# Patient Record
Sex: Male | Born: 1985
Health system: Southern US, Community
[De-identification: ages and names within clinical notes are randomized; demographics above are authoritative.]

## PROBLEM LIST (undated history)

## (undated) ENCOUNTER — Ambulatory Visit (HOSPITAL_COMMUNITY): Admission: EM

## (undated) DIAGNOSIS — T7840XA Allergy, unspecified, initial encounter: Secondary | ICD-10-CM

## (undated) HISTORY — DX: Allergy, unspecified, initial encounter: T78.40XA

---
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
## 2014-05-17 ENCOUNTER — Emergency Department (HOSPITAL_COMMUNITY)
Admission: EM | Admit: 2014-05-17 | Discharge: 2014-05-17 | Disposition: A | Discharge status: Home / Self Care | Payer: Self-pay | Attending: Emergency Medicine | Admitting: Emergency Medicine

## 2014-05-17 ENCOUNTER — Encounter (HOSPITAL_COMMUNITY): Payer: Self-pay | Admitting: Emergency Medicine

## 2014-05-17 DIAGNOSIS — B079 Viral wart, unspecified: Secondary | ICD-10-CM | POA: Insufficient documentation

## 2014-05-17 MED ORDER — SALICYLIC ACID 17 % EX GEL: Freq: Every day | CUTANEOUS | Status: DC

## 2014-05-17 NOTE — Discharge Instructions (Signed)
Return to the ED with any concerns including difficulty breathing, fainting, decreased level of alertness/lethargy, or any other alarming symptoms °

## 2014-05-17 NOTE — ED Notes (Signed)
Pt presents to department for evaluation of L foot pain. Ongoing x8 months. States itching and redness to foot. No relief with over the counter creams and ointments. 5/10 pain at the time.

## 2014-05-17 NOTE — ED Provider Notes (Signed)
CSN: [id number]     Arrival date & time 05/17/14  1808 History   First MD Initiated Contact with Patient 05/17/14 1908     Chief Complaint  Patient presents with  . Foot Pain     (Consider location/radiation/quality/duration/timing/severity/associated sxs/prior Treatment) HPI Pt presenting with c/o pain between his toes on his left foot. He states area is painful and itching.  He first noted this 8 months ago.  He went to the pharmacy and was recommended to use athlete's foot powder and then ointment both of which did not work.  He states the pain has continued and now it is painful for him to wear shoes.  No fever.  No injury to foot.  There are no other associated systemic symptoms, there are no other alleviating or modifying factors.   History reviewed. No pertinent past medical history. History reviewed. No pertinent past surgical history. No family history on file. History  Substance Use Topics  . Smoking status: Never Smoker   . Smokeless tobacco: Not on file  . Alcohol Use: No    Review of Systems ROS reviewed and all otherwise negative except for mentioned in HPI    Allergies  Review of patient's allergies indicates no known allergies.  Home Medications   Prior to Admission medications   Medication Sig Start Date End Date Taking? Authorizing Provider  salicylic acid 17 % gel Apply topically daily. 05/17/14   Ethelda Chick, MD   BP 146/85  Pulse 75  Temp(Src) 97.7 F (36.5 C) (Oral)  Resp 20  Wt 163 lb 9 oz (74.191 kg)  SpO2 98% Vitals reviewed Physical Exam Physical Examination: General appearance - alert, well appearing, and in no distress Mental status - alert, oriented to person, place, and time Musculoskeletal - no joint tenderness, deformity or swelling Extremities - peripheral pulses normal, no pedal edema, no clubbing or cyanosis Skin - normal coloration and turgor, no rashes, verrucous between 4th and 5th toes of left foot at base of web space, no  surrounding erythema or fluctuance  ED Course  Procedures (including critical care time) Labs Review Labs Reviewed - No data to display  Imaging Review No results found.   EKG Interpretation None      MDM   Final diagnoses:  Wart    Pt presenting with c/o pain between 4th/5th toes- on exam lesion is most c/w wart.  Advised topical salicylic acid and/or seeing primary care physician or dermatologist for liquid nitrogen treatment.  I have discussed strict precautions to return to the ER including or any other new or worsening symptoms. The patient understands and accepts the medical plan as it's been recorded in the chart, and I have answered their questions. Discharge instructions concerning home care and prescriptions have been given. The patient is stable and is discharged to home in good condition.     Ethelda Chick, MD 05/17/14 [phone]

## 2015-01-21 ENCOUNTER — Encounter (HOSPITAL_COMMUNITY): Payer: Self-pay | Admitting: Emergency Medicine

## 2015-01-21 ENCOUNTER — Emergency Department (HOSPITAL_COMMUNITY)
Admission: EM | Admit: 2015-01-21 | Discharge: 2015-01-21 | Disposition: A | Discharge status: Home / Self Care | Payer: BLUE CROSS/BLUE SHIELD | Source: Home / Self Care | Attending: Family Medicine | Admitting: Family Medicine

## 2015-01-21 DIAGNOSIS — B356 Tinea cruris: Secondary | ICD-10-CM

## 2015-01-21 DIAGNOSIS — B353 Tinea pedis: Secondary | ICD-10-CM

## 2015-01-21 MED ORDER — CLOTRIMAZOLE-BETAMETHASONE 1-0.05 % EX CREA: TOPICAL_CREAM | CUTANEOUS | Status: DC

## 2015-01-21 NOTE — ED Provider Notes (Signed)
CSN: [id number]     Arrival date & time 01/21/15  1054 History   First MD Initiated Contact with Patient 01/21/15 1108     Chief Complaint  Patient presents with  . Rash   (Consider location/radiation/quality/duration/timing/severity/associated sxs/prior Treatment) HPI       29 year old male presents for evaluation of a painful rash between his fourth and fifth toes on his left foot as well as a rash in his groin. This started a few weeks ago. He went to the pharmacy and was told to get some sort of powder but it is not helping despite using it every day. No swelling or drainage. Pain is worse when he was wearing shoes. Both the groin rash and the rash in his toes are very itchy.   History reviewed. No pertinent past medical history. History reviewed. No pertinent past surgical history. No family history on file. History  Substance Use Topics  . Smoking status: Never Smoker   . Smokeless tobacco: Not on file  . Alcohol Use: No    Review of Systems  Skin: Positive for rash.    Allergies  Review of patient's allergies indicates no known allergies.  Home Medications   Prior to Admission medications   Medication Sig Start Date End Date Taking? Authorizing Provider  clotrimazole-betamethasone (LOTRISONE) cream Apply to affected area 2 times daily for 2 weeks 01/21/15   Graylon GoodZachary H Trust Crago, PA-C  salicylic acid 17 % gel Apply topically daily. 05/17/14   Ethelda ChickMartha K Linker, MD   BP 135/86 mmHg  Pulse 62  Temp(Src) 98.6 F (37 C) (Oral)  Resp 16  SpO2 96% Physical Exam  Constitutional: He is oriented to person, place, and time. He appears well-developed and well-nourished. No distress.  HENT:  Head: Normocephalic.  Pulmonary/Chest: Effort normal. No respiratory distress.  Neurological: He is alert and oriented to person, place, and time. Coordination normal.  Skin: Skin is warm and dry. Rash noted. He is not diaphoretic.  Between the fourth and fifth toes on the left foot, sent with  tinea pedis. In the groin he has a rash consistent with tinea cruris  Psychiatric: He has a normal mood and affect. Judgment normal.  Nursing note and vitals reviewed.   ED Course  Procedures (including critical care time) Labs Review Labs Reviewed - No data to display  Imaging Review No results found.   MDM   1. Tinea pedis of left foot   2. Tinea cruris    Treat with Lotrisone. Follow-up when necessary   Meds ordered this encounter  Medications  . clotrimazole-betamethasone (LOTRISONE) cream    Sig: Apply to affected area 2 times daily for 2 weeks    Dispense:  45 g    Refill:  0       Graylon GoodZachary H Dez Stauffer, PA-C 01/21/15 1134

## 2015-01-21 NOTE — Discharge Instructions (Signed)
Athlete's Foot Athlete's foot (tinea pedis) is a fungal infection of the skin on the feet. It often occurs on the skin between the toes or underneath the toes. It can also occur on the soles of the feet. Athlete's foot is more likely to occur in hot, humid weather. Not washing your feet or changing your socks often enough can contribute to athlete's foot. The infection can spread from person to person (contagious). CAUSES Athlete's foot is caused by a fungus. This fungus thrives in warm, moist places. Most people get athlete's foot by sharing shower stalls, towels, and wet floors with an infected person. People with weakened immune systems, including those with diabetes, may be more likely to get athlete's foot. SYMPTOMS   Itchy areas between the toes or on the soles of the feet.  White, flaky, or scaly areas between the toes or on the soles of the feet.  Tiny, intensely itchy blisters between the toes or on the soles of the feet.  Tiny cuts on the skin. These cuts can develop a bacterial infection.  Thick or discolored toenails. DIAGNOSIS  Your caregiver can usually tell what the problem is by doing a physical exam. Your caregiver may also take a skin sample from the rash area. The skin sample may be examined under a microscope, or it may be tested to see if fungus will grow in the sample. A sample may also be taken from your toenail for testing. TREATMENT  Over-the-counter and prescription medicines can be used to kill the fungus. These medicines are available as powders or creams. Your caregiver can suggest medicines for you. Fungal infections respond slowly to treatment. You may need to continue using your medicine for several weeks. PREVENTION   Do not share towels.  Wear sandals in wet areas, such as shared locker rooms and shared showers.  Keep your feet dry. Wear shoes that allow air to circulate. Wear cotton or wool socks. HOME CARE INSTRUCTIONS   Take medicines as directed by  your caregiver. Do not use steroid creams on athlete's foot.  Keep your feet clean and cool. Wash your feet daily and dry them thoroughly, especially between your toes.  Change your socks every day. Wear cotton or wool socks. In hot climates, you may need to change your socks 2 to 3 times per day.  Wear sandals or canvas tennis shoes with good air circulation.  If you have blisters, soak your feet in Burow's solution or Epsom salts for 20 to 30 minutes, 2 times a day to dry out the blisters. Make sure you dry your feet thoroughly afterward. SEEK MEDICAL CARE IF:   You have a fever.  You have swelling, soreness, warmth, or redness in your foot.  You are not getting better after 7 days of treatment.  You are not completely cured after 30 days.  You have any problems caused by your medicines. MAKE SURE YOU:   Understand these instructions.  Will watch your condition.  Will get help right away if you are not doing well or get worse. Document Released: 11/25/2000 Document Revised: 02/20/2012 Document Reviewed: 09/16/2011 [facility] Patient Information 2015 [place], [place]. This information is not intended to replace advice given to you by your health care provider. Make sure you discuss any questions you have with your health care provider.  Jock Itch Jock itch is a fungal infection of the skin in the groin area. It is sometimes called "ringworm" even though it is not caused by a worm. A fungus  is a type of germ that thrives in dark, damp places.  CAUSES  This infection may spread from:  A fungus infection elsewhere on the body (such as athlete's foot).  Sharing towels or clothing. This infection is more common in:  Hot, humid climates.  People who wear tight-fitting clothing or wet bathing suits for long periods of time.  Athletes.  Overweight people.  People with diabetes. SYMPTOMS  Jock itch causes the following symptoms:  Red, pink or brown rash in the groin. Rash may  spread to the thighs, anus, and buttocks.  Itching. DIAGNOSIS  Your caregiver may make the diagnosis by looking at the rash. Sometimes a skin scraping will be sent to test for fungus. Testing can be done either by looking under the microscope or by doing a culture (test to try to grow the fungus). A culture can take up to 2 weeks to come back. TREATMENT  Jock itch may be treated with:  Skin cream or ointment to kill fungus.  Medicine by mouth to kill fungus.  Skin cream or ointment to calm the itching.  Compresses or medicated powders to dry the infected skin. HOME CARE INSTRUCTIONS   Be sure to treat the rash completely. Follow your caregiver's instructions. It can take a couple of weeks to treat. If you do not treat the infection long enough, the rash can come back.  Wear loose-fitting clothing.  Men should wear cotton boxer shorts.  Women should wear cotton underwear.  Avoid hot baths.  Dry the groin area well after bathing. SEEK MEDICAL CARE IF:   Your rash is worse.  Your rash is spreading.  Your rash returns after treatment is finished.  Your rash is not gone in 4 weeks. Fungal infections are slow to respond to treatment. Some redness may remain for several weeks after the fungus is gone. SEEK IMMEDIATE MEDICAL CARE IF:  The area becomes red, warm, tender, and swollen.  You have a fever. Document Released: 11/18/2002 Document Revised: 02/20/2012 Document Reviewed: 10/17/2008 [facility] Patient Information 2015 [place]. This information is not intended to replace advice given to you by your health care provider. Make sure you discuss any questions you have with your health care provider.

## 2015-01-21 NOTE — ED Notes (Signed)
C/o rash in between left 4th and 5th toes; has been applying OTC powder w/no relief Also c/o rash on genital area Alert, no signs of acute distress.

## 2015-02-22 ENCOUNTER — Emergency Department (INDEPENDENT_AMBULATORY_CARE_PROVIDER_SITE_OTHER)
Admission: EM | Admit: 2015-02-22 | Discharge: 2015-02-22 | Disposition: A | Discharge status: Home / Self Care | Payer: BLUE CROSS/BLUE SHIELD | Source: Home / Self Care | Attending: Family Medicine | Admitting: Family Medicine

## 2015-02-22 ENCOUNTER — Encounter (HOSPITAL_COMMUNITY): Payer: Self-pay | Admitting: Family Medicine

## 2015-02-22 DIAGNOSIS — A048 Other specified bacterial intestinal infections: Secondary | ICD-10-CM

## 2015-02-22 DIAGNOSIS — B9681 Helicobacter pylori [H. pylori] as the cause of diseases classified elsewhere: Secondary | ICD-10-CM

## 2015-02-22 DIAGNOSIS — I10 Essential (primary) hypertension: Secondary | ICD-10-CM

## 2015-02-22 LAB — POCT H PYLORI SCREEN: H. PYLORI SCREEN, POC: POSITIVE — AB

## 2015-02-22 MED ORDER — ONDANSETRON HCL 4 MG PO TABS: 4.0000 mg | ORAL_TABLET | Freq: Three times a day (TID) | ORAL | Status: DC | PRN

## 2015-02-22 MED ORDER — AMOXICILL-CLARITHRO-LANSOPRAZ PO MISC: Freq: Two times a day (BID) | ORAL | Status: DC

## 2015-02-22 NOTE — ED Provider Notes (Signed)
CSN: [id number]     Arrival date & time 02/22/15  1610 History   First MD Initiated [id number] with Patient 02/22/15 1710     No chief complaint on file.  (Consider location/radiation/quality/duration/timing/severity/associated sxs/prior Treatment) HPI  Rash: noted on previous exam. Resolved.   Abd pain: started 2 weeks ago. Then resolved after a few days then returned 1 week ago. Associated w/ lots of burping. Tried OTC gas relief medicine. BM daily. Left sided w/ some radiation to R. Comes and goes. Improves w/ burping. Worse at night after meals. Denies CP, fevers, SOB, palpitations, nausea, vomiting, diarrhea.    History reviewed. No pertinent past medical history. History reviewed. No pertinent past surgical history. Family History  Problem Relation Age of Onset  . Cancer Neg Hx   . Diabetes Neg Hx   . Heart failure Neg Hx   . Hyperlipidemia Neg Hx    History  Substance Use Topics  . Smoking status: Never Smoker   . Smokeless tobacco: Not on file  . Alcohol Use: No    Review of Systems Per HPI with all other pertinent systems negative.   Allergies  Review of patient's allergies indicates no known allergies.  Home Medications   Prior to Admission medications   Medication Sig Start Date End Date Taking? Authorizing Provider  amoxicillin-clarithromycin-lansoprazole [facility](PREVPAC) combo pack Take by mouth 2 (two) times daily. Follow package directions. 02/22/15   Ozella Rocksavid J Yuka Lallier, MD  ondansetron (ZOFRAN) 4 MG tablet Take 1 tablet (4 mg total) by mouth every 8 (eight) hours as needed for nausea or vomiting. 02/22/15   Ozella Rocksavid J Jeffory Snelgrove, MD   BP 150/81 mmHg  Pulse 60  Temp(Src) 98.5 F (36.9 C) (Oral)  Resp 18 Physical Exam  Constitutional: He is oriented to person, place, and time. He appears well-developed and well-nourished. No distress.  HENT:  Head: Normocephalic.  Eyes: Pupils are equal, round, and reactive to light.  Neck: Normal range of motion.  Cardiovascular: Normal  rate, normal heart sounds and intact distal pulses.   No murmur heard. Pulmonary/Chest: Effort normal and breath sounds normal. No respiratory distress.  Abdominal: Bowel sounds are normal. He exhibits no distension and no mass. There is no tenderness. There is no rebound and no guarding.  Musculoskeletal: Normal range of motion. He exhibits no tenderness.  Neurological: He is alert and oriented to person, place, and time.  Skin: Skin is warm. He is not diaphoretic.  Psychiatric: He has a normal mood and affect. His behavior is normal. Judgment normal.    ED Course  Procedures (including critical care time) Labs Review Labs Reviewed - No data to display  Imaging Review No results found.   MDM   1. Essential hypertension   2. H. pylori infection    H. pylori point of care test positive. Start Prevpac. Hypertension likely secondary to acute illness. Follow-up PCP when necessary. Zofran when necessary nausea  Precautions given and all questions answered  Shelly Flattenavid Ayelet Gruenewald, MD Family Medicine 02/22/2015, 5:25 PM      Ozella Rocksavid J Carollynn Pennywell, MD 02/22/15 1725

## 2015-02-22 NOTE — Discharge Instructions (Signed)
You have an infection of your stomach called H Pylori. This will require antibiotics and other medicines in order to clear. Please taken as prescribed. Please use the Zofran for general nausea and upset stomach. Please avoid using NSAIDs (ibuprofen, Advil, Aleve, Motrin). Please take a daily probiotic.

## 2015-02-22 NOTE — ED Notes (Signed)
Assessment per Dr. Merrell. 

## 2015-10-07 ENCOUNTER — Ambulatory Visit (INDEPENDENT_AMBULATORY_CARE_PROVIDER_SITE_OTHER): Payer: BLUE CROSS/BLUE SHIELD | Admitting: Family Medicine

## 2015-10-07 ENCOUNTER — Telehealth: Payer: Self-pay

## 2015-10-07 VITALS — BP 120/70 | HR 60 | Temp 97.7°F | Resp 16 | Ht 69.5 in | Wt 173.8 lb

## 2015-10-07 DIAGNOSIS — R142 Eructation: Secondary | ICD-10-CM

## 2015-10-07 DIAGNOSIS — R140 Abdominal distension (gaseous): Secondary | ICD-10-CM | POA: Diagnosis not present

## 2015-10-07 DIAGNOSIS — Z0000 Encounter for general adult medical examination without abnormal findings: Secondary | ICD-10-CM

## 2015-10-07 LAB — POCT CBC
Granulocyte percent: 48.2 %G (ref 37–80)
HCT, POC: 42.7 % — AB (ref 43.5–53.7)
Hemoglobin: 13.9 g/dL — AB (ref 14.1–18.1)
Lymph, poc: 1.3 (ref 0.6–3.4)
MCH, POC: 23.2 pg — AB (ref 27–31.2)
MCHC: 32.6 g/dL (ref 31.8–35.4)
MCV: 71.2 fL — AB (ref 80–97)
MID (cbc): 0.3 (ref 0–0.9)
MPV: 7.6 fL (ref 0–99.8)
POC Granulocyte: 1.5 — AB (ref 2–6.9)
POC LYMPH PERCENT: 42.7 %L (ref 10–50)
POC MID %: 9.1 %M (ref 0–12)
Platelet Count, POC: 2.8 10*3/uL — AB (ref 142–424)
RBC: 6 M/uL (ref 4.69–6.13)
RDW, POC: 14.3 %
WBC: 3.1 10*3/uL — AB (ref 4.6–10.2)

## 2015-10-07 LAB — POCT URINALYSIS DIP (MANUAL ENTRY)
Bilirubin, UA: NEGATIVE
Blood, UA: NEGATIVE
Glucose, UA: NEGATIVE
Ketones, POC UA: NEGATIVE
Leukocytes, UA: NEGATIVE
Nitrite, UA: NEGATIVE
Protein Ur, POC: NEGATIVE
Spec Grav, UA: 1.025
Urobilinogen, UA: 0.2
pH, UA: 5.5

## 2015-10-07 LAB — COMPLETE METABOLIC PANEL WITH GFR
ALT: 16 U/L (ref 9–46)
AST: 21 U/L (ref 10–40)
Albumin: 4.3 g/dL (ref 3.6–5.1)
Alkaline Phosphatase: 58 U/L (ref 40–115)
BUN: 18 mg/dL (ref 7–25)
CO2: 28 mmol/L (ref 20–31)
Calcium: 9.6 mg/dL (ref 8.6–10.3)
Chloride: 103 mmol/L (ref 98–110)
Creat: 0.97 mg/dL (ref 0.60–1.35)
GFR, Est African American: >89 mL/min (ref >=60)
GFR, Est Non African American: >89 mL/min (ref >=60)
Glucose, Bld: 86 mg/dL (ref 65–99)
Potassium: 4.6 mmol/L (ref 3.5–5.3)
Sodium: 139 mmol/L (ref 135–146)
Total Bilirubin: 0.6 mg/dL (ref 0.2–1.2)
Total Protein: 7.3 g/dL (ref 6.1–8.1)

## 2015-10-07 LAB — LIPID PANEL
Cholesterol: 176 mg/dL (ref 125–200)
HDL: 67 mg/dL (ref >=40)
LDL Cholesterol: 99 mg/dL (ref <130)
Total CHOL/HDL Ratio: 2.6 Ratio (ref <=5.0)
Triglycerides: 51 mg/dL (ref <150)
VLDL: 10 mg/dL (ref <30)

## 2015-10-07 NOTE — Patient Instructions (Signed)
I want you to pick up some Bean-o and take 1 tablet twice a day. If you're still having gas problems after 2 weeks of doing this, I will you to call me so it can further evaluate the problem.    Health Maintenance, Male A healthy lifestyle and preventative care can promote health and wellness.  Maintain regular health, dental, and eye exams.  Eat a healthy diet. Foods like vegetables, fruits, whole grains, low-fat dairy products, and lean protein foods contain the nutrients you need and are low in calories. Decrease your intake of foods high in solid fats, added sugars, and salt. Get information about a proper diet from your health care provider, if necessary.  Regular physical exercise is one of the most important things you can do for your health. Most adults should get at least 150 minutes of moderate-intensity exercise (any activity that increases your heart rate and causes you to sweat) each week. In addition, most adults need muscle-strengthening exercises on 2 or more days a week.   Maintain a healthy weight. The body mass index (BMI) is a screening tool to identify possible weight problems. It provides an estimate of body fat based on height and weight. Your health care provider can find your BMI and can help you achieve or maintain a healthy weight. For males 20 years and older:  A BMI below 18.5 is considered underweight.  A BMI of 18.5 to 24.9 is normal.  A BMI of 25 to 29.9 is considered overweight.  A BMI of 30 and above is considered obese.  Maintain normal blood lipids and cholesterol by exercising and minimizing your intake of saturated fat. Eat a balanced diet with plenty of fruits and vegetables. Blood tests for lipids and cholesterol should begin at age 28 and be repeated every 5 years. If your lipid or cholesterol levels are high, you are over age 28, or you are at high risk for heart disease, you may need your cholesterol levels checked more frequently.Ongoing high lipid  and cholesterol levels should be treated with medicines if diet and exercise are not working.  If you smoke, find out from your health care provider how to quit. If you do not use tobacco, do not start.  Lung cancer screening is recommended for adults aged 55-80 years who are at high risk for developing lung cancer because of a history of smoking. A yearly low-dose CT scan of the lungs is recommended for people who have at least a 30-pack-year history of smoking and are current smokers or have quit within the past 15 years. A pack year of smoking is smoking an average of 1 pack of cigarettes a day for 1 year (for example, a 30-pack-year history of smoking could mean smoking 1 pack a day for 30 years or 2 packs a day for 15 years). Yearly screening should continue until the smoker has stopped smoking for at least 15 years. Yearly screening should be stopped for people who develop a health problem that would prevent them from having lung cancer treatment.  If you choose to drink alcohol, do not have more than 2 drinks per day. One drink is considered to be 12 oz (360 mL) of beer, 5 oz (150 mL) of wine, or 1.5 oz (45 mL) of liquor.  Avoid the use of street drugs. Do not share needles with anyone. Ask for help if you need support or instructions about stopping the use of drugs.  High blood pressure causes heart disease and increases  the risk of stroke. High blood pressure is more likely to develop in:  People who have blood pressure in the end of the normal range (100-139/85-89 mm Hg).  People who are overweight or obese.  People who are African American.  If you are 44-16 years of age, have your blood pressure checked every 3-5 years. If you are 32 years of age or older, have your blood pressure checked every year. You should have your blood pressure measured twice--once when you are at a hospital or clinic, and once when you are not at a hospital or clinic. Record the average of the two measurements.  To check your blood pressure when you are not at a hospital or clinic, you can use:  An automated blood pressure machine at a pharmacy.  A home blood pressure monitor.  If you are 78-20 years old, ask your health care provider if you should take aspirin to prevent heart disease.  Diabetes screening involves taking a blood sample to check your fasting blood sugar level. This should be done once every 3 years after age 50 if you are at a normal weight and without risk factors for diabetes. Testing should be considered at a younger age or be carried out more frequently if you are overweight and have at least 1 risk factor for diabetes.  Colorectal cancer can be detected and often prevented. Most routine colorectal cancer screening begins at the age of 85 and continues through age 63. However, your health care provider may recommend screening at an earlier age if you have risk factors for colon cancer. On a yearly basis, your health care provider may provide home test kits to check for hidden blood in the stool. A small camera at the end of a tube may be used to directly examine the colon (sigmoidoscopy or colonoscopy) to detect the earliest forms of colorectal cancer. Talk to your health care provider about this at age 33 when routine screening begins. A direct exam of the colon should be repeated every 5-10 years through age 1, unless early forms of precancerous polyps or small growths are found.  People who are at an increased risk for hepatitis B should be screened for this virus. You are considered at high risk for hepatitis B if:  You were born in a country where hepatitis B occurs often. Talk with your health care provider about which countries are considered high risk.  Your parents were born in a high-risk country and you have not received a shot to protect against hepatitis B (hepatitis B vaccine).  You have HIV or AIDS.  You use needles to inject street drugs.  You live with, or have  sex with, someone who has hepatitis B.  You are a man who has sex with other men (MSM).  You get hemodialysis treatment.  You take certain medicines for conditions like cancer, organ transplantation, and autoimmune conditions.  Hepatitis C blood testing is recommended for all people born from [birth year] through 1964 and any individual with known risk factors for hepatitis C.  Healthy men should no longer receive prostate-specific antigen (PSA) blood tests as part of routine cancer screening. Talk to your health care provider about prostate cancer screening.  Testicular cancer screening is not recommended for adolescents or adult males who have no symptoms. Screening includes self-exam, a health care provider exam, and other screening tests. Consult with your health care provider about any symptoms you have or any concerns you have about testicular cancer.  Practice  safe sex. Use condoms and avoid high-risk sexual practices to reduce the spread of sexually transmitted infections (STIs).  You should be screened for STIs, including gonorrhea and chlamydia if:  You are sexually active and are younger than 24 years.  You are older than 24 years, and your health care provider tells you that you are at risk for this type of infection.  Your sexual activity has changed since you were last screened, and you are at an increased risk for chlamydia or gonorrhea. Ask your health care provider if you are at risk.  If you are at risk of being infected with HIV, it is recommended that you take a prescription medicine daily to prevent HIV infection. This is called pre-exposure prophylaxis (PrEP). You are considered at risk if:  You are a man who has sex with other men (MSM).  You are a heterosexual man who is sexually active with multiple partners.  You take drugs by injection.  You are sexually active with a partner who has HIV.  Talk with your health care provider about whether you are at high risk of  being infected with HIV. If you choose to begin PrEP, you should first be tested for HIV. You should then be tested every 3 months for as long as you are taking PrEP.  Use sunscreen. Apply sunscreen liberally and repeatedly throughout the day. You should seek shade when your shadow is shorter than you. Protect yourself by wearing long sleeves, pants, a wide-brimmed hat, and sunglasses year round whenever you are outdoors.  Tell your health care provider of new moles or changes in moles, especially if there is a change in shape or color. Also, tell your health care provider if a mole is larger than the size of a pencil eraser.  A one-time screening for abdominal aortic aneurysm (AAA) and surgical repair of large AAAs by ultrasound is recommended for men aged 52-75 years who are current or former smokers.  Stay current with your vaccines (immunizations).   This information is not intended to replace advice given to you by your health care provider. Make sure you discuss any questions you have with your health care provider.   Document Released: 05/26/2008 Document Revised: 12/19/2014 Document Reviewed: 04/25/2011 Elsevier Interactive Patient Education [employer].

## 2015-10-07 NOTE — Addendum Note (Signed)
Addended by: Elvina SidleLAUENSTEIN, Pieper Kasik on: 10/07/2015 04:11 PM   Modules accepted: Orders

## 2015-10-07 NOTE — Telephone Encounter (Signed)
Patient is calling because he's having trouble getting his medication from the pharmacy and they requested that we send the prescription electronically. I spoke to the provider and he states that patient is supposed to pick up Bean -O and it's an over the counter medication. I called patient back within 5 minutes and there was know answer. If patient calls back please let him know!

## 2015-10-07 NOTE — Progress Notes (Signed)
Patient ID: Darryl Campbell, male   DOB: 1986/10/09, 29 y.o.   MRN: [id number]  By signing my name below, I, Essence Howell, attest that this documentation has been prepared under the direction and in the presence of Elvina SidleKurt Derell Bruun, MD Electronically Signed: Charline BillsEssence Howell, ED Scribe 10/07/2015 at 12:15 PM.  Patient ID: Darryl DuffelMatabisi Manzo MRN: [id number], DOB: 1986/10/09, 29 y.o. Date of Encounter: 10/07/2015, 12:05 PM  Primary Physician: No PCP Per Patient  Chief Complaint  Patient presents with  . Employment Physical    pt needs physical for work    HPI: 29 y.o. year old male with history below presents with for an employment physical. 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  Increased Flatulence  Pt reports increased belching and flatulence for the past 3 months. He was seen at another Urgent Care site for similar symptoms in March. Pt was prescribed Prevpac which he states did not provide any relief.   Pt recently moved in with [place] parents [place]   Past Medical History  Diagnosis Date  . Allergy      Home Meds: Prior to Admission medications   Medication Sig Start Date End Date Taking? Authorizing Provider  amoxicillin-clarithromycin-lansoprazole [facility](PREVPAC) combo pack Take by mouth 2 (two) times daily. Follow package directions. Patient not taking: Reported on 10/07/2015 02/22/15   Ozella Rocksavid J Merrell, MD  ondansetron (ZOFRAN) 4 MG tablet Take 1 tablet (4 mg total) by mouth every 8 (eight) hours as needed for nausea or vomiting. Patient not taking: Reported on 10/07/2015 02/22/15   Ozella Rocksavid J Merrell, MD    Allergies: No Known Allergies  Social History   Social History  . Marital Status: Single    Spouse Name: N/A  . Number of Children: N/A  . Years of Education: N/A   Occupational History  . Not on file.   Social History Main Topics  . Smoking status: Never Smoker   . Smokeless tobacco: Not on file  . Alcohol Use: No  .  Drug Use: No  . Sexual Activity: Not on file   Other Topics Concern  . Not on file   Social History Narrative     Review of Systems: Constitutional: negative for chills, fever, night sweats, weight changes, or fatigue  HEENT: negative for vision changes, hearing loss, congestion, rhinorrhea, ST, epistaxis, or sinus pressure Cardiovascular: negative for chest pain or palpitations Respiratory: negative for hemoptysis, wheezing, shortness of breath, or cough Abdominal: negative for abdominal pain, nausea, vomiting, diarrhea, or constipation Dermatological: negative for rash Neurologic: negative for headache, dizziness, or syncope All other systems reviewed and are otherwise negative with the exception to those above and in the HPI.  Physical Exam: Blood pressure 120/70, pulse 60, temperature 97.7 F (36.5 C), temperature source Oral, resp. rate 16, height 5' 9.5" (1.765 m), weight 173 lb 12.8 oz (78.835 kg), SpO2 98 %., Body mass index is 25.31 kg/(m^2). General: Well developed, well nourished, in no acute distress. Head: Normocephalic, atraumatic, eyes without discharge, sclera non-icteric, nares are without discharge. Bilateral auditory canals clear, TM's are without perforation, pearly grey and translucent with reflective cone of light bilaterally. Oral cavity moist, posterior pharynx without exudate, erythema, peritonsillar abscess, or post nasal drip.  Neck: Supple. No thyromegaly. Full ROM. No lymphadenopathy. Lungs: Clear bilaterally to auscultation without wheezes, rales, or rhonchi. Breathing is unlabored. Heart: RRR with S1 S2. No murmurs, rubs, or gallops appreciated. Abdomen: Soft, non-tender, non-distended with normoactive bowel  sounds. No hepatomegaly. No rebound/guarding. No obvious abdominal masses. Mildly bloated.  Msk:  Strength and tone normal for age. GU: No hernia noted. Normal exam Extremities/Skin: Warm and dry. No clubbing or cyanosis. No edema. No rashes or  suspicious lesions. Neuro: Alert and oriented X 3. Moves all extremities spontaneously. Gait is normal. CNII-XII grossly in tact. Psych:  Responds to questions appropriately with a normal affect.    ASSESSMENT AND PLAN:  29 y.o. year old male with  No diagnosis found.  This chart was scribed in my presence and reviewed by me personally.    ICD-9-CM ICD-10-CM   1. Annual physical exam V70.0 Z00.00 POCT CBC     POCT urinalysis dipstick     COMPLETE METABOLIC PANEL WITH GFR     Lipid panel     TSH  2. Bloating symptom 787.3 R14.0 COMPLETE METABOLIC PANEL WITH GFR     TSH  3. Eructation 787.3 R14.2    patient obviously did not get any benefit from his Prevpac prescribed a month ago. Instead he got side effects of diarrhea and loose stools. The chronic gas situation is a little difficult to pinpoint. It's possible that he's picked up a Giardia infection, but at this point, because he's moved from [place] and has a different diet, I think it's reasonable to try Beano to correct the gas. He's to call me back in 2 weeks if the problem continues  Signed, Elvina Sidle, MD   Signed, Elvina Sidle, MD 10/07/2015 12:05 PM

## 2015-10-08 LAB — TSH: TSH: 1.078 u[IU]/mL (ref 0.350–4.500)

## 2015-10-08 NOTE — Telephone Encounter (Signed)
Tried to call Pt. Back but his phone is off

## 2015-12-30 ENCOUNTER — Ambulatory Visit (INDEPENDENT_AMBULATORY_CARE_PROVIDER_SITE_OTHER): Payer: BLUE CROSS/BLUE SHIELD | Admitting: Physician Assistant

## 2015-12-30 ENCOUNTER — Ambulatory Visit (INDEPENDENT_AMBULATORY_CARE_PROVIDER_SITE_OTHER): Payer: BLUE CROSS/BLUE SHIELD

## 2015-12-30 VITALS — BP 132/94 | HR 53 | Temp 97.6°F | Resp 16 | Ht 69.0 in | Wt 166.0 lb

## 2015-12-30 DIAGNOSIS — IMO0001 Reserved for inherently not codable concepts without codable children: Secondary | ICD-10-CM

## 2015-12-30 DIAGNOSIS — R142 Eructation: Secondary | ICD-10-CM

## 2015-12-30 DIAGNOSIS — R030 Elevated blood-pressure reading, without diagnosis of hypertension: Secondary | ICD-10-CM

## 2015-12-30 DIAGNOSIS — R1084 Generalized abdominal pain: Secondary | ICD-10-CM

## 2015-12-30 DIAGNOSIS — D649 Anemia, unspecified: Secondary | ICD-10-CM

## 2015-12-30 DIAGNOSIS — R143 Flatulence: Secondary | ICD-10-CM | POA: Diagnosis not present

## 2015-12-30 DIAGNOSIS — R141 Gas pain: Secondary | ICD-10-CM | POA: Diagnosis not present

## 2015-12-30 LAB — POCT CBC
Granulocyte percent: 45.3 %G (ref 37–80)
HCT, POC: 42.2 % — AB (ref 43.5–53.7)
HEMOGLOBIN: 13.8 g/dL — AB (ref 14.1–18.1)
LYMPH, POC: 2 (ref 0.6–3.4)
MCH, POC: 22.5 pg — AB (ref 27–31.2)
MCHC: 32.8 g/dL (ref 31.8–35.4)
MCV: 68.7 fL — AB (ref 80–97)
MID (cbc): 0.1 (ref 0–0.9)
MPV: 6.9 fL (ref 0–99.8)
PLATELET COUNT, POC: 230 10*3/uL (ref 142–424)
POC Granulocyte: 1.8 — AB (ref 2–6.9)
POC LYMPH %: 51 % — AB (ref 10–50)
POC MID %: 3.7 %M (ref 0–12)
RBC: 6.14 M/uL — AB (ref 4.69–6.13)
RDW, POC: 14.3 %
WBC: 3.9 10*3/uL — AB (ref 4.6–10.2)

## 2015-12-30 LAB — POC MICROSCOPIC URINALYSIS (UMFC): MUCUS RE: ABSENT

## 2015-12-30 LAB — POCT URINALYSIS DIP (MANUAL ENTRY)
Bilirubin, UA: NEGATIVE
Blood, UA: NEGATIVE
Glucose, UA: NEGATIVE
LEUKOCYTES UA: NEGATIVE
NITRITE UA: NEGATIVE
PH UA: 6
Spec Grav, UA: 1.025
UROBILINOGEN UA: 1

## 2015-12-30 MED ORDER — RANITIDINE HCL 150 MG PO TABS: 150.0000 mg | ORAL_TABLET | Freq: Two times a day (BID) | ORAL | Status: DC

## 2015-12-30 NOTE — Patient Instructions (Signed)
The specialist will contact you directly to schedule an appointment there.  In the meantime, we will wait for the remaining lab results and try the ranitidine.

## 2015-12-30 NOTE — Progress Notes (Signed)
Patient ID: Darryl Campbell, male    DOB: January 11, 1986, 30 y.o.   MRN: [id number]  PCP: No PCP Per Patient  Subjective:   Chief Complaint  Patient presents with  . Groin Pain    Lower, X 1 year  . Abdominal Pain    Lower, X 1 year    HPI Presents for evaluation of abdominal pain and belching.  Burping a lot, passing a lot of gas. Pain in various places all over the abdomen, seems to radiate toward the groin. It's worse with exercise, specifically, the low "groin" pain occurs when he does crunches (he points to the suprapubic area to locate the pain he describes as groin."  When he burps after eating he partially regurgitates the food. Occurs daily. Was seen elsewhere 02/2015 and had a positive H pylori ab screening, was prescribed a PrevPac, without resolution of his symptoms. He was then advised to try Beano (09/2015), also without benefit.  Increased urinary frequency, no urgency, no dysuria and no hematuria. BMs BID, soft, brown. No blood. No straining. He notes that the PrevPac caused diarrhea, but resolved when he stopped it. No pain or swelling of the scrotum or testicles. No penile discharge. Reports consistent condom use with sexual activity.  Sometimes has pain in the chest with stretching of the pectoralis muscles. No SOB or dyspnea. Occasional HA. No dizziness.  Wants to get back to the gym. Wants to know what is going on and take care of it.   Review of Systems As above.    There are no active problems to display for this patient.    Prior to Admission medications   Not on File     No Known Allergies     Objective:  Physical Exam  Constitutional: He is oriented to person, place, and time. Vital signs are normal. He appears well-developed and well-nourished. He is active and cooperative. No distress.  BP 152/118 mmHg  Pulse 53  Temp(Src) 97.6 F (36.4 C) (Oral)  Resp 16  Ht  (1.753 m)  Wt 166 lb (75.297 kg)  BMI 24.50 kg/m2  SpO2 98%    HENT:  Head: Normocephalic and atraumatic.  Right Ear: Hearing normal.  Left Ear: Hearing normal.  Eyes: Conjunctivae are normal. No scleral icterus.  Neck: Normal range of motion. Neck supple. No thyromegaly present.  Cardiovascular: Normal rate, regular rhythm and normal heart sounds.   Pulses:      Radial pulses are 2+ on the right side, and 2+ on the left side.  Pulmonary/Chest: Effort normal and breath sounds normal.  Notes that on exhalation after deep inhalation, the muscles in the mid-abdomen and flanks feel tight.  Abdominal: Normal appearance and bowel sounds are normal. He exhibits no distension, no ascites, no pulsatile midline mass and no mass. There is no hepatosplenomegaly. There is no tenderness (very ticklish). There is no rebound, no guarding and no CVA tenderness. Hernia confirmed negative in the ventral area.  Lymphadenopathy:       Head (right side): No tonsillar, no preauricular, no posterior auricular and no occipital adenopathy present.       Head (left side): No tonsillar, no preauricular, no posterior auricular and no occipital adenopathy present.    He has no cervical adenopathy.       Right: No supraclavicular adenopathy present.       Left: No supraclavicular adenopathy present.  Neurological: He is alert and oriented to person, place, and time. No sensory deficit.  Skin: Skin  is warm, dry and intact. No rash noted. No cyanosis or erythema. Nails show no clubbing.  Psychiatric: He has a normal mood and affect.    Results for orders placed or performed in visit on 12/30/15  POCT urinalysis dipstick  Result Value Ref Range   Color, UA yellow yellow   Clarity, UA clear clear   Glucose, UA negative negative   Bilirubin, UA negative negative   Ketones, POC UA trace (5) (A) negative   Spec Grav, UA 1.025    Blood, UA negative negative   pH, UA 6.0    Protein Ur, POC trace (A) negative   Urobilinogen, UA 1.0    Nitrite, UA Negative Negative   Leukocytes,  UA Negative Negative  POCT Microscopic Urinalysis ([facility])  Result Value Ref Range   WBC,UR,HPF,POC Few (A) None WBC/hpf   RBC,UR,HPF,POC None None RBC/hpf   Bacteria None None, Too numerous to count   Mucus Absent Absent   Epithelial Cells, UR Per Microscopy None None, Too numerous to count cells/hpf  POCT CBC  Result Value Ref Range   WBC 3.9 (A) 4.6 - 10.2 K/uL   Lymph, poc 2.0 0.6 - 3.4   POC LYMPH PERCENT 51.0 (A) 10 - 50 %L   MID (cbc) 0.1 0 - 0.9   POC MID % 3.7 0 - 12 %M   POC Granulocyte 1.8 (A) 2 - 6.9   Granulocyte percent 45.3 37 - 80 %G   RBC 6.14 (A) 4.69 - 6.13 M/uL   Hemoglobin 13.8 (A) 14.1 - 18.1 g/dL   HCT, POC 16.1 (A) 09.6 - 53.7 %   MCV 68.7 (A) 80 - 97 fL   MCH, POC 22.5 (A) 27 - 31.2 pg   MCHC 32.8 31.8 - 35.4 g/dL   RDW, POC 04.5 %   Platelet Count, POC 230 142 - 424 K/uL   MPV 6.9 0 - 99.8 fL    Acute Abdominal Series: [facility] reading (PRIMARY) by  Dr. Cleta Alberts. Non-specific bowel gas pattern. Boderline cardiomegaly-please comment on heart size.        Assessment & Plan:   1. Generalized abdominal pain 2. Flatulence, eructation and gas pain Unclear etiology. It appears that part of his pain is musculoskeletal in the abdominal wall. Trial of H2 blocker BID. - POCT urinalysis dipstick - POCT Microscopic Urinalysis ([facility]) - Comprehensive metabolic panel - DG Abd Acute W/Chest - POCT CBC - POCT SEDIMENTATION RATE - Celiac Panel - Ambulatory referral to Gastroenterology - ranitidine (ZANTAC) 150 MG tablet; Take 1 tablet (150 mg total) by mouth 2 (two) times daily.  Dispense: 60 tablet; Refill: 0  3. Elevated blood pressure With borderline cardiomegaly on chest film. Await radiology over read, recheck BP in 4-6 weeks.  4. Anemia, unspecified anemia type Recheck in 4-6 weeks, with iron studies and possible hemoglobin electrophoresis.   Fernande Bras, PA-C Physician Assistant-Certified [facility]

## 2015-12-31 LAB — GLIA (IGA/G) + TTG IGA
Gliadin IgA: 7 Units (ref <20)
Gliadin IgG: 3 Units (ref <20)
Tissue Transglutaminase Ab, IgA: 1 U/mL (ref <4)

## 2015-12-31 LAB — COMPREHENSIVE METABOLIC PANEL
ALK PHOS: 50 U/L (ref 40–115)
ALT: 14 U/L (ref 9–46)
AST: 19 U/L (ref 10–40)
Albumin: 4.3 g/dL (ref 3.6–5.1)
BILIRUBIN TOTAL: 0.8 mg/dL (ref 0.2–1.2)
BUN: 15 mg/dL (ref 7–25)
CO2: 29 mmol/L (ref 20–31)
CREATININE: 1.08 mg/dL (ref 0.60–1.35)
Calcium: 9.7 mg/dL (ref 8.6–10.3)
Chloride: 104 mmol/L (ref 98–110)
GLUCOSE: 81 mg/dL (ref 65–99)
Potassium: 4.2 mmol/L (ref 3.5–5.3)
Sodium: 139 mmol/L (ref 135–146)
TOTAL PROTEIN: 7 g/dL (ref 6.1–8.1)

## 2016-01-01 ENCOUNTER — Encounter: Payer: Self-pay | Admitting: Physician Assistant

## 2016-01-15 ENCOUNTER — Telehealth: Payer: Self-pay | Admitting: Internal Medicine

## 2016-01-15 ENCOUNTER — Encounter: Payer: Self-pay | Admitting: Internal Medicine

## 2016-01-15 NOTE — Telephone Encounter (Signed)
Unable to contact pt - referral

## 2016-02-17 ENCOUNTER — Ambulatory Visit: Payer: BLUE CROSS/BLUE SHIELD

## 2016-10-07 ENCOUNTER — Ambulatory Visit (INDEPENDENT_AMBULATORY_CARE_PROVIDER_SITE_OTHER): Payer: BLUE CROSS/BLUE SHIELD | Admitting: Family Medicine

## 2016-10-07 VITALS — BP 110/60 | HR 65 | Temp 98.7°F | Resp 16 | Ht 69.75 in | Wt 177.0 lb

## 2016-10-07 DIAGNOSIS — Z113 Encounter for screening for infections with a predominantly sexual mode of transmission: Secondary | ICD-10-CM

## 2016-10-07 DIAGNOSIS — Z0000 Encounter for general adult medical examination without abnormal findings: Secondary | ICD-10-CM | POA: Diagnosis not present

## 2016-10-07 LAB — POC MICROSCOPIC URINALYSIS (UMFC): Mucus: ABSENT

## 2016-10-07 LAB — POCT URINALYSIS DIP (MANUAL ENTRY)
Glucose, UA: NEGATIVE
LEUKOCYTES UA: NEGATIVE
NITRITE UA: NEGATIVE
PH UA: 6
RBC UA: NEGATIVE
Spec Grav, UA: 1.02
Urobilinogen, UA: 1

## 2016-10-07 MED ORDER — OMEPRAZOLE 40 MG PO CPDR: 40.0000 mg | DELAYED_RELEASE_CAPSULE | Freq: Every day | ORAL | 3 refills | Status: DC

## 2016-10-07 NOTE — Patient Instructions (Addendum)
Start Omeprazole 40 mg for chronic belching and dyspepsia.  I will contact you regarding your lab results.   IF you received an x-ray today, you will receive an invoice from [facility]. Please contact [facility] at [phone] with questions or concerns regarding your invoice.   IF you received labwork today, you will receive an invoice from [facility]. Please contact [facility] at [phone] with questions or concerns regarding your invoice.   Our billing staff will not be able to assist you with questions regarding bills from these companies.  You will be contacted with the lab results as soon as they are available. The fastest way to get your results is to activate your My Chart account. Instructions are located on the last page of this paperwork. If you have not heard from us regarding the results in 2 weeks, please contact this office.     Constipation, Adult Constipation is when a person has fewer than three bowel movements a week, has difficulty having a bowel movement, or has stools that are dry, hard, or larger than normal. As people grow older, constipation is more common. A low-fiber diet, not taking in enough fluids, and taking certain medicines may make constipation worse.  CAUSES   Certain medicines, such as antidepressants, pain medicine, iron supplements, antacids, and water pills.   Certain diseases, such as diabetes, irritable bowel syndrome (IBS), thyroid disease, or depression.   Not drinking enough water.   Not eating enough fiber-rich foods.   Stress or travel.   Lack of physical activity or exercise.   Ignoring the urge to have a bowel movement.   Using laxatives too much.  SIGNS AND SYMPTOMS   Having fewer than three bowel movements a week.   Straining to have a bowel movement.   Having stools that are hard, dry, or larger than normal.   Feeling full or bloated.   Pain in the lower abdomen.    Not feeling relief after having a bowel movement.  DIAGNOSIS  Your health care provider will take a medical history and perform a physical exam. Further testing may be done for severe constipation. Some tests may include:  A barium enema X-ray to examine your rectum, colon, and, sometimes, your small intestine.   A sigmoidoscopy to examine your lower colon.   A colonoscopy to examine your entire colon. TREATMENT  Treatment will depend on the severity of your constipation and what is causing it. Some dietary treatments include drinking more fluids and eating more fiber-rich foods. Lifestyle treatments may include regular exercise. If these diet and lifestyle recommendations do not help, your health care provider may recommend taking over-the-counter laxative medicines to help you have bowel movements. Prescription medicines may be prescribed if over-the-counter medicines do not work.  HOME CARE INSTRUCTIONS   Eat foods that have a lot of fiber, such as fruits, vegetables, whole grains, and beans.  Limit foods high in fat and processed sugars, such as french fries, hamburgers, cookies, candies, and soda.   A fiber supplement may be added to your diet if you cannot get enough fiber from foods.   Drink enough fluids to keep your urine clear or pale yellow.   Exercise regularly or as directed by your health care provider.   Go to the restroom when you have the urge to go. Do not hold it.   Only take over-the-counter or prescription medicines as directed by your health care provider. Do not take other medicines for constipation without talking  to your health care provider first.  SEEK IMMEDIATE MEDICAL CARE IF:   You have bright red blood in your stool.   Your constipation lasts for more than 4 days or gets worse.   You have abdominal or rectal pain.   You have thin, pencil-like stools.   You have unexplained weight loss. MAKE SURE YOU:   Understand these  instructions.  Will watch your condition.  Will get help right away if you are not doing well or get worse.   This information is not intended to replace advice given to you by your health care provider. Make sure you discuss any questions you have with your health care provider.   Document Released: 08/26/2004 Document Revised: 12/19/2014 Document Reviewed: 09/09/2013 Elsevier Interactive Patient Education 2016 [employer].  Exercising to [employer] Exercising regularly is important. It has many health benefits, such as:  Improving your overall fitness, flexibility, and endurance.  Increasing your bone density.  Helping with weight control.  Decreasing your body fat.  Increasing your muscle strength.  Reducing stress and tension.  Improving your overall health. In order to become healthy and stay healthy, it is recommended that you do moderate-intensity and vigorous-intensity exercise. You can tell that you are exercising at a moderate intensity if you have a higher heart rate and faster breathing, but you are still able to hold a conversation. You can tell that you are exercising at a vigorous intensity if you are breathing much harder and faster and cannot hold a conversation while exercising. HOW OFTEN SHOULD I EXERCISE? Choose an activity that you enjoy and set realistic goals. Your health care provider can help you to make an activity plan that works for you. Exercise regularly as directed by your health care provider. This may include:   Doing resistance training twice each week, such as:  Push-ups.  Sit-ups.  Lifting weights.  Using resistance bands.  Doing a given intensity of exercise for a given amount of time. Choose from these options:  150 minutes of moderate-intensity exercise every week.  75 minutes of vigorous-intensity exercise every week.  A mix of moderate-intensity and vigorous-intensity exercise every week. Children, pregnant women, people who  are out of shape, people who are overweight, and older adults may need to consult a health care provider for individual recommendations. If you have any sort of medical condition, be sure to consult your health care provider before starting a new exercise program.  WHAT ARE SOME EXERCISE IDEAS? Some moderate-intensity exercise ideas include:   Walking at a rate of 1 mile in 15 minutes.  Biking.  Hiking.  Golfing.  Dancing. Some vigorous-intensity exercise ideas include:   Walking at a rate of at least 4.5 miles per hour.  Jogging or running at a rate of 5 miles per hour.  Biking at a rate of at least 10 miles per hour.  Lap swimming.  Roller-skating or in-line skating.  Cross-country skiing.  Vigorous competitive sports, such as football, basketball, and soccer.  Jumping rope.  Aerobic dancing. WHAT ARE SOME EVERYDAY ACTIVITIES THAT CAN HELP ME TO GET EXERCISE?  Yard work, such as:  Child psychotherapist.  Raking and bagging leaves.  Washing and waxing your car.  Pushing a stroller.  Shoveling snow.  Gardening.  Washing windows or floors. HOW CAN I BE MORE ACTIVE IN MY DAY-TO-DAY ACTIVITIES?  Use the stairs instead of the elevator.  Take a walk during your lunch break.  If you drive, park your car  farther away from work or school.  If you take public transportation, get off one stop early and walk the rest of the way.  Make all of your phone calls while standing up and walking around.  Get up, stretch, and walk around every 30 minutes throughout the day. WHAT GUIDELINES SHOULD I FOLLOW WHILE EXERCISING?  Do not exercise so much that you hurt yourself, feel dizzy, or get very short of breath.  Consult your health care provider before starting a new exercise program.  Wear comfortable clothes and shoes with good support.  Drink plenty of water while you exercise to prevent dehydration or heat stroke. Body water is lost during exercise and must be  replaced.  Work out until you breathe faster and your heart beats faster.   This information is not intended to replace advice given to you by your health care provider. Make sure you discuss any questions you have with your health care provider.   Document Released: 12/31/2010 Document Revised: 12/19/2014 Document Reviewed: 05/01/2014 Elsevier Interactive Patient Education [employer]

## 2016-10-07 NOTE — Progress Notes (Signed)
Patient ID: Darryl Campbell, male    DOB: 17-Feb-1986, 30 y.o.   MRN: [id number]  PCP: No PCP Per Patient  Chief Complaint  Patient presents with  . Employment Physical    Subjective:   HPI 30 year old male presents for evaluation of complete annual exam. Patient is familiar to [facility]. He reports his employer requires employee to obtain annual physical exams to receive reduced health insurance cost. He denies any current health concerns. Denies any known family history of diabetes or cardiovascular disease.  Social History   Social History  . Marital status: Single    Spouse name: n/a  . Number of children: 0  . Years of education: 12th grade   Occupational History  . machine operator    Social History Main Topics  . Smoking status: Never Smoker  . Smokeless tobacco: Never Used  . Alcohol use No  . Drug use: No  . Sexual activity: Yes    Partners: Female   Other Topics Concern  . Not on file   Social History Narrative   Lives with his parents.   Born in [place]. Came to the [place] age 48.    Family History  Problem Relation Age of Onset  . Cancer Neg Hx   . Diabetes Neg Hx   . Heart failure Neg Hx   . Hyperlipidemia Neg Hx     Review of Systems  Constitutional: Negative for appetite change, chills and fatigue.       Fever 2 days ago and was treated with antibiotic.  HENT: Negative.        No recent dental visit  Eyes: Negative.        No routine vision exam  Respiratory: Positive for cough, shortness of breath and wheezing.        Related to prior illness times 2 days ago which reports significant improvement.  Gastrointestinal: Negative for constipation, nausea, rectal pain and vomiting.       Still has a problem with belching and gas. Reports stool every so often not regular.  Endocrine: Negative.   Genitourinary: Negative for difficulty urinating, discharge, dysuria and penile pain.       Denies unprotected sex within last year. Requests STD testing    Musculoskeletal: Negative.   Skin: Negative.   Allergic/Immunologic: Negative.   Neurological: Negative for dizziness and headaches.  Hematological: Negative.   Psychiatric/Behavioral: Negative for agitation and dysphoric mood. The patient is not nervous/anxious.    There are no active problems to display for this patient.  Prior to Admission medications   Medication Sig Start Date End Date Taking? Authorizing Provider  ranitidine (ZANTAC) 150 MG tablet Take 1 tablet (150 mg total) by mouth 2 (two) times daily. 12/30/15  Yes Chelle Jeffery, PA-C  No Known Allergies   Objective:  Physical Exam  Constitutional: He is oriented to person, place, and time. He appears well-developed and well-nourished.  HENT:  Head: Normocephalic and atraumatic.  Right Ear: External ear normal.  Left Ear: External ear normal.  Nose: Nose normal.  Mouth/Throat: Oropharynx is clear and moist.  Eyes: Conjunctivae and EOM are normal. Pupils are equal, round, and reactive to light.  Neck: Normal range of motion. Neck supple.  Cardiovascular: Normal rate, regular rhythm, normal heart sounds and intact distal pulses.   Pulmonary/Chest: Effort normal and breath sounds normal.  Abdominal: Soft. Bowel sounds are normal.  Musculoskeletal: Normal range of motion.  Neurological: He is alert and oriented to person, place, and time.  Skin: Skin is warm and dry.  Psychiatric: He has a normal mood and affect. His behavior is normal. Judgment and thought content normal.    Vitals:   10/07/16 1512  BP: 110/60  Pulse: 65  Resp: 16  Temp: 98.7 F (37.1 C)     Assessment & Plan:  1. Annual physical exam Age-appropriate anticipatory guidance provided.  2. Screen for STD (sexually transmitted disease) - POCT Microscopic Urinalysis ([facility]) - POCT urinalysis dipstick - GC/Chlamydia Probe Amp - RPR - HIV antibody  Follow-up as needed.  We will follow-up with you regarding lab results.  Godfrey PickKimberly S. Tiburcio PeaHarris,  MSN, FNP-C [facility]

## 2016-10-08 ENCOUNTER — Encounter: Payer: Self-pay | Admitting: Family Medicine

## 2016-10-08 LAB — RPR

## 2016-10-08 LAB — HIV ANTIBODY (ROUTINE TESTING W REFLEX): HIV: NONREACTIVE

## 2016-10-08 LAB — GC/CHLAMYDIA PROBE AMP
CT Probe RNA: NOT DETECTED
GC Probe RNA: NOT DETECTED

## 2016-10-08 NOTE — Progress Notes (Signed)
October 08, 2016   [facility] [address] [place] [address]   Dear Mr. Leanne LovelyMasoyi,  Below are the results from your recent visit were normal.  Resulted Orders  GC/Chlamydia Probe Amp  Result Value Ref Range   CT Probe RNA NOT DETECTED      Comment:                        **Normal Reference Range: NOT DETECTED**   This test was performed using the APTIMA COMBO2 Assay ([employer]   The analytical performance characteristics of this assay, when used to test SurePath specimens have been determined by [facility]      GC Probe RNA NOT DETECTED      Comment:                        **Normal Reference Range: NOT DETECTED**   This test was performed using the APTIMA COMBO2 Assay ([employer]   The analytical performance characteristics of this assay, when used to test SurePath specimens have been determined by [facility]      Narrative   Performed at:  [facility]                [address], [address]                [place], [place] [address]  RPR  Result Value Ref Range   RPR Ser Ql NON REAC NON REAC   Narrative   Performed at:  [facility]                [address], [address]                [place], [place] [address]  HIV antibody  Result Value Ref Range   HIV 1&2 Ab, 4th Generation NONREACTIVE NONREACTIVE     Comment:       HIV-1 antigen and HIV-1/HIV-2 antibodies were not detected.  There is no laboratory evidence of HIV infection.   HIV-1/2 Antibody Diff        Not indicated. HIV-1 RNA, Qual TMA          Not indicated.     PLEASE NOTE: This information has been disclosed to you from records whose confidentiality may be protected by state law. If your state requires such protection, then the state law prohibits you from making any further disclosure of the information without the specific written consent of the person to whom it pertains, or as otherwise permitted by law. A general authorization for the  release of medical or other information is NOT sufficient for this purpose.   The performance of this assay has not been clinically validated in patients less than 899 years old.   For additional information please refer to http://education.[facility].com/faq/FAQ106.  (This link is being provided for informational/educational purposes only.)      Narrative   Performed at:  [facility]                [address], [address]                [place], [place] [address]     If you have any questions or concerns, please don't hesitate to call.  Sincerely,   Joaquin CourtsKimberly Shamela Haydon, FNP

## 2016-11-04 IMAGING — CR DG ABDOMEN ACUTE W/ 1V CHEST
3 series · 3 of 3 positions shown · non-contrast
Comparison: None.

CLINICAL DATA: Generalized abdominal pain

EXAM:
DG ABDOMEN ACUTE W/ 1V CHEST

[PA]
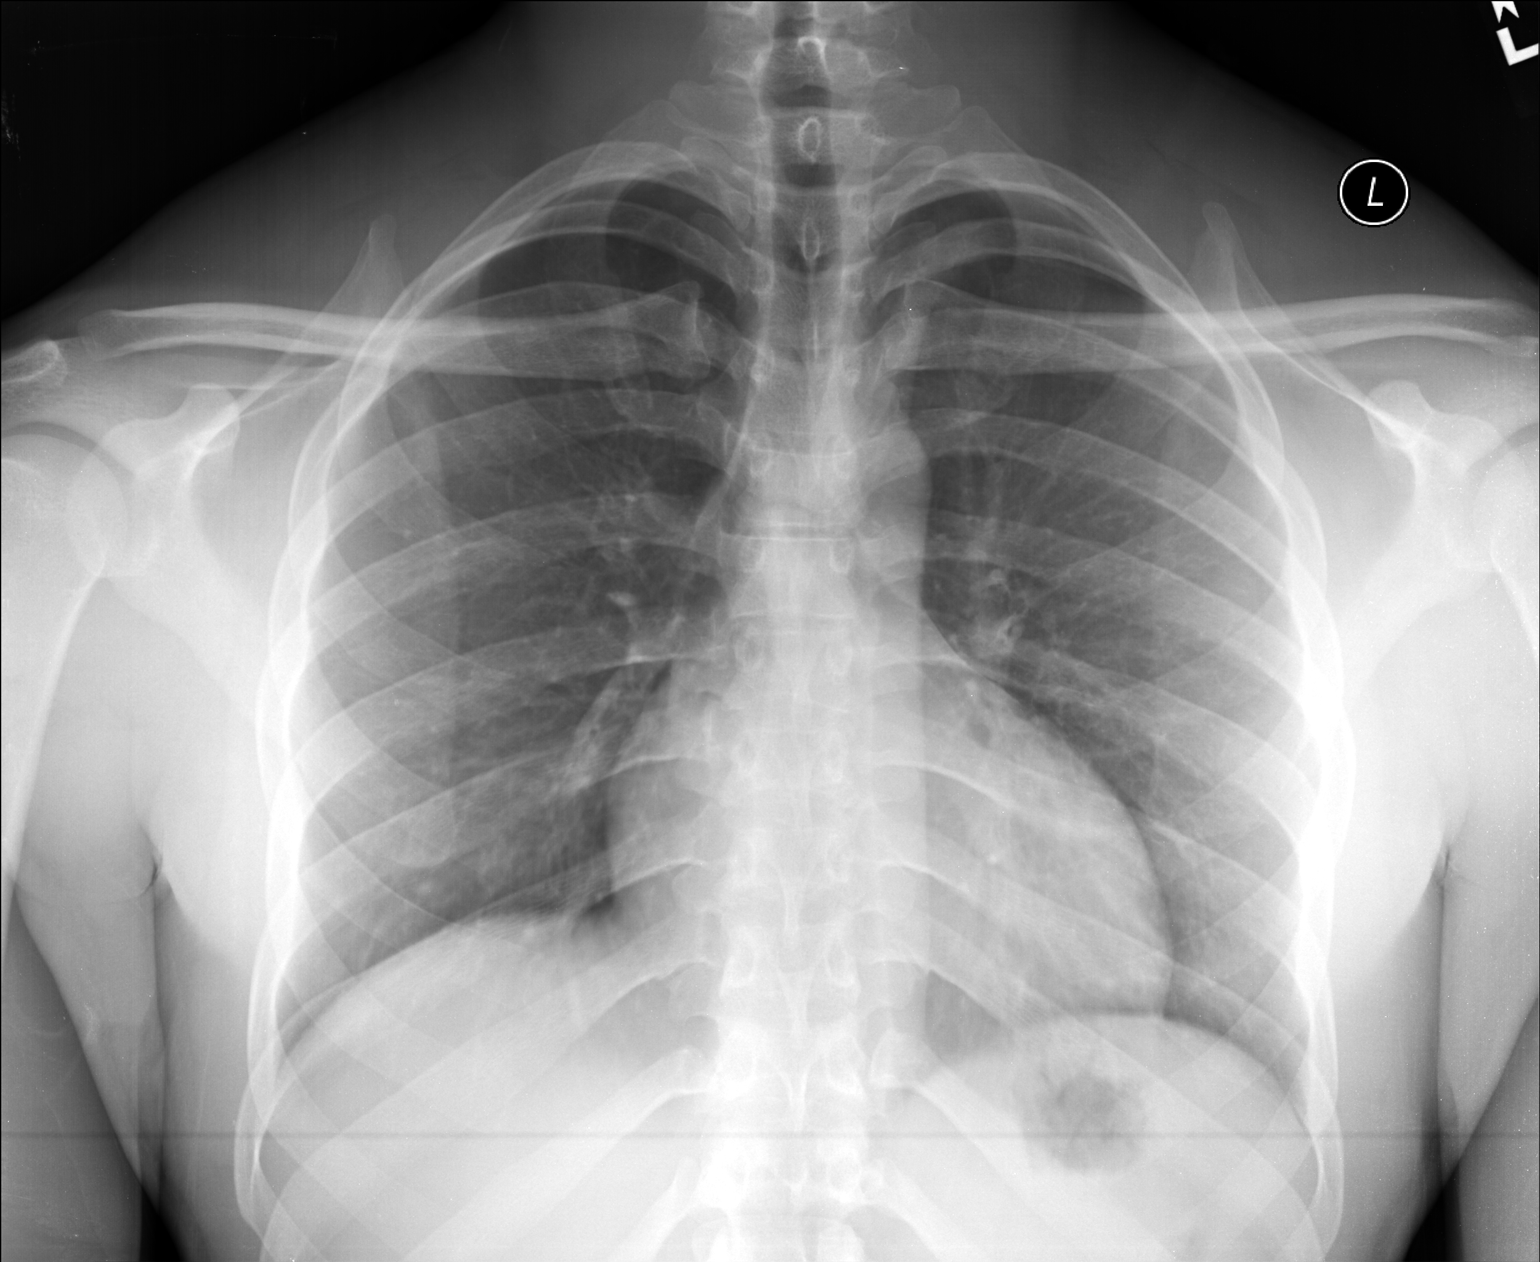

[AP (1 of 2)]
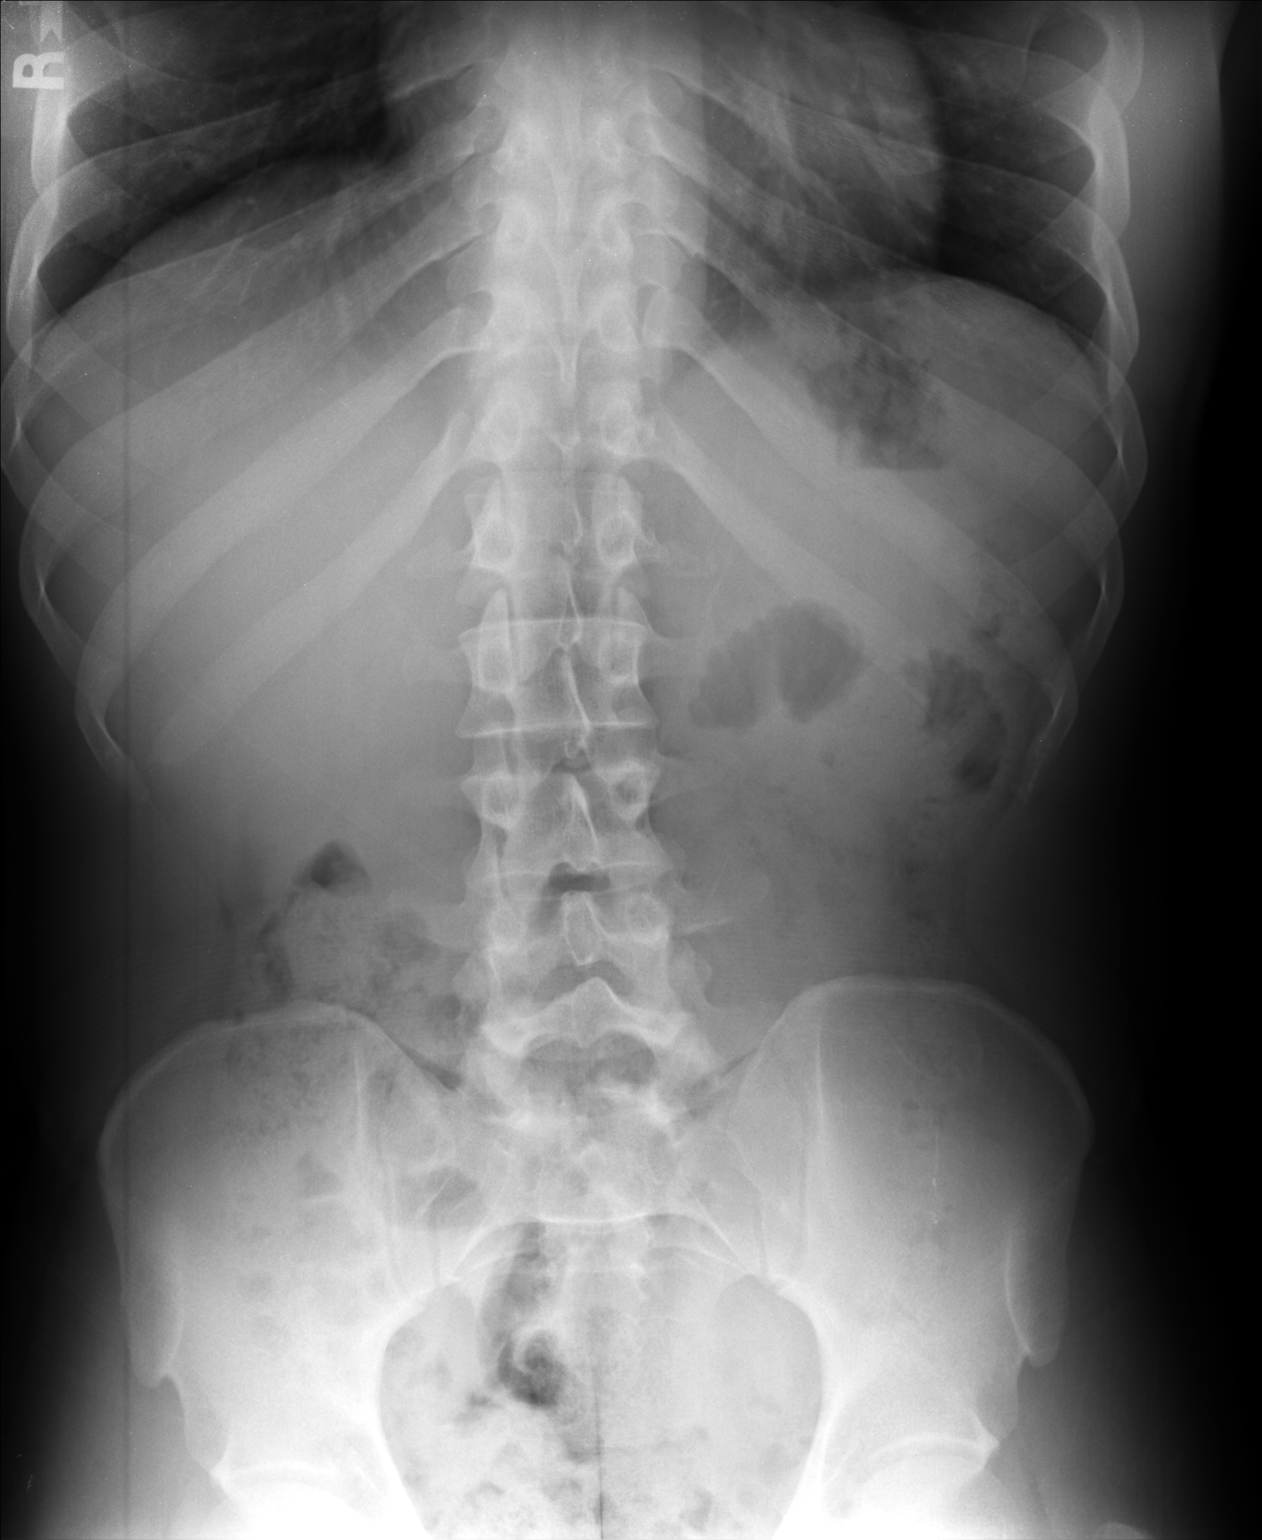

[AP (2 of 2)]
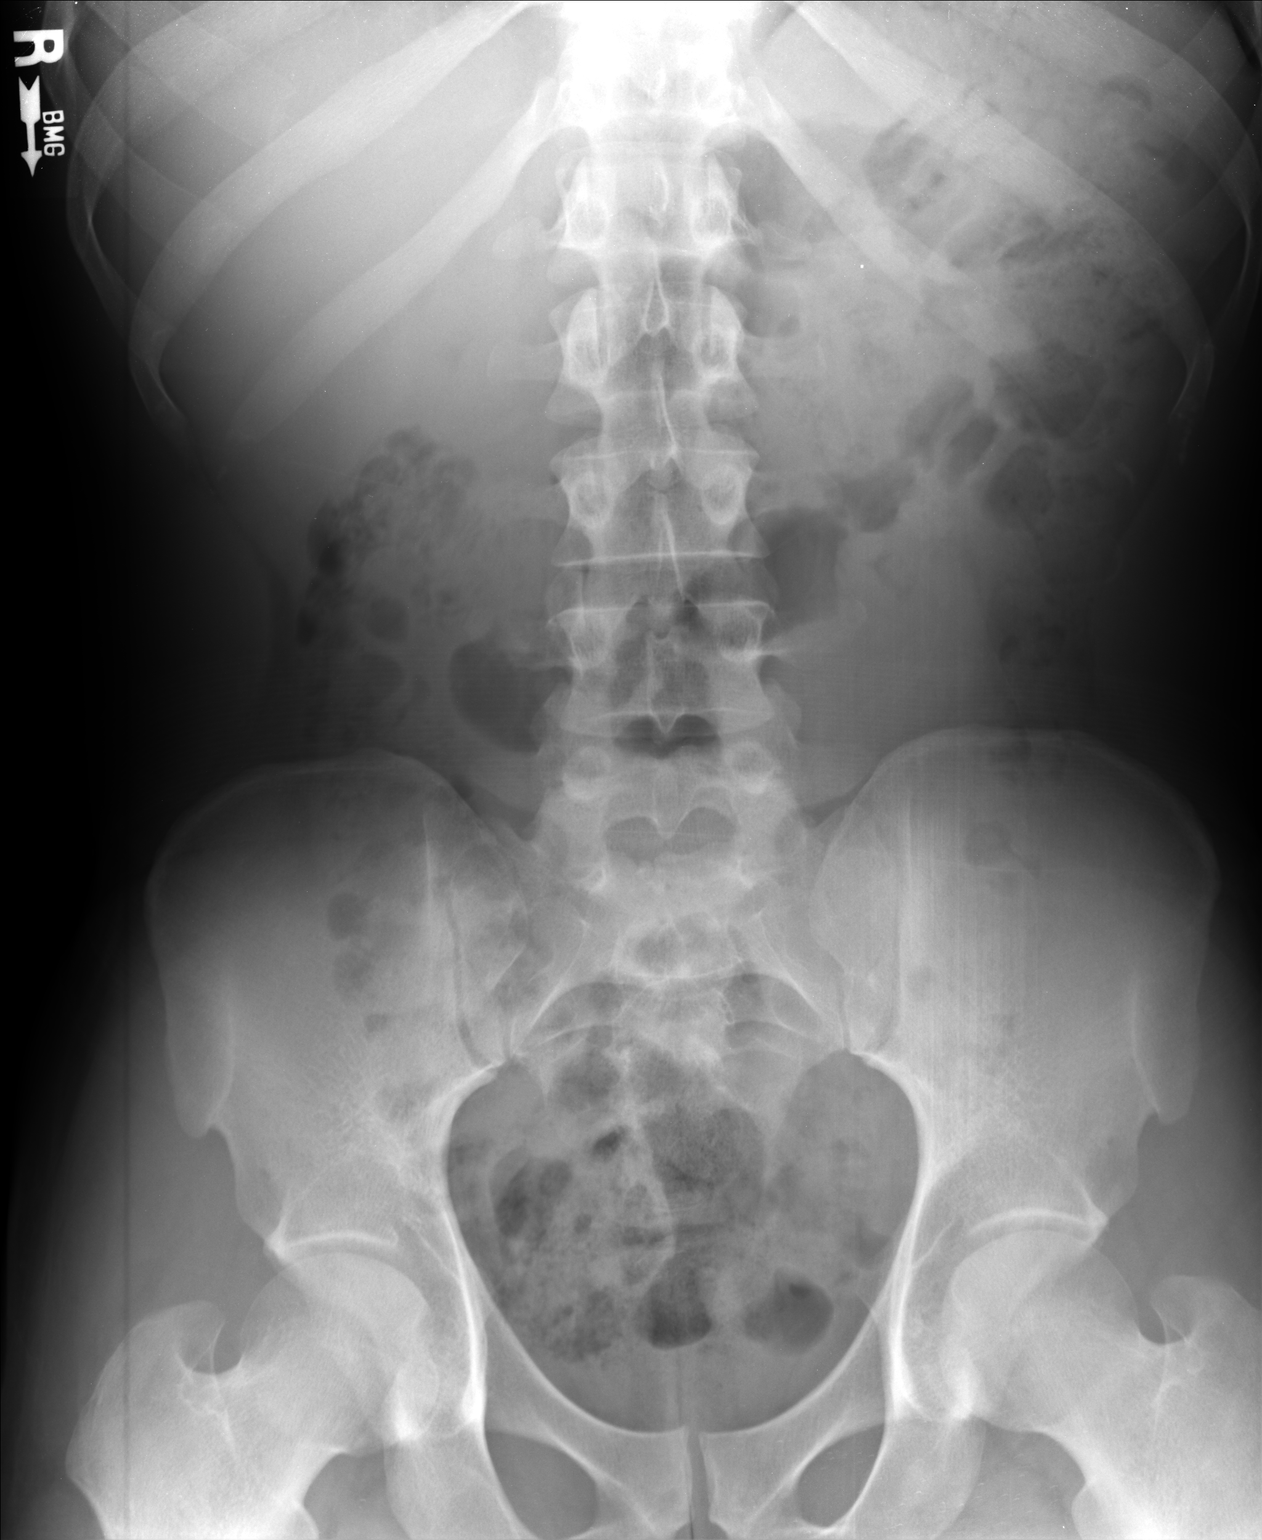

[3 of 3 positions shown; findings below may reference images not displayed]

FINDINGS: There is no evidence of dilated bowel loops or free intraperitoneal
air. No radiopaque calculi or other significant radiographic
abnormality is seen. Heart size and mediastinal contours are within
normal limits. Both lungs are clear.
IMPRESSION: Negative abdominal radiographs.  No acute cardiopulmonary disease.

## 2017-01-09 ENCOUNTER — Emergency Department (HOSPITAL_COMMUNITY)
Admission: EM | Admit: 2017-01-09 | Discharge: 2017-01-09 | Disposition: A | Discharge status: Home / Self Care | Payer: BLUE CROSS/BLUE SHIELD | Attending: Emergency Medicine | Admitting: Emergency Medicine

## 2017-01-09 ENCOUNTER — Encounter (HOSPITAL_COMMUNITY): Payer: Self-pay | Admitting: Neurology

## 2017-01-09 DIAGNOSIS — Z5321 Procedure and treatment not carried out due to patient leaving prior to being seen by health care provider: Secondary | ICD-10-CM | POA: Insufficient documentation

## 2017-01-09 DIAGNOSIS — R109 Unspecified abdominal pain: Secondary | ICD-10-CM | POA: Insufficient documentation

## 2017-01-09 DIAGNOSIS — Z79899 Other long term (current) drug therapy: Secondary | ICD-10-CM | POA: Insufficient documentation

## 2017-01-09 LAB — CBC
HCT: 42.2 % (ref 39.0–52.0)
HEMOGLOBIN: 13.4 g/dL (ref 13.0–17.0)
MCH: 22.6 pg — AB (ref 26.0–34.0)
MCHC: 31.8 g/dL (ref 30.0–36.0)
MCV: 71 fL — AB (ref 78.0–100.0)
Platelets: 200 10*3/uL (ref 150–400)
RBC: 5.94 MIL/uL — AB (ref 4.22–5.81)
RDW: 14.1 % (ref 11.5–15.5)
WBC: 3.6 10*3/uL — ABNORMAL LOW (ref 4.0–10.5)

## 2017-01-09 LAB — COMPREHENSIVE METABOLIC PANEL
ALT: 19 U/L (ref 17–63)
ANION GAP: 12 (ref 5–15)
AST: 23 U/L (ref 15–41)
Albumin: 3.9 g/dL (ref 3.5–5.0)
Alkaline Phosphatase: 45 U/L (ref 38–126)
BUN: 8 mg/dL (ref 6–20)
CHLORIDE: 99 mmol/L — AB (ref 101–111)
CO2: 27 mmol/L (ref 22–32)
Calcium: 9.3 mg/dL (ref 8.9–10.3)
Creatinine, Ser: 1.08 mg/dL (ref 0.61–1.24)
GFR calc non Af Amer: >60 mL/min (ref >60)
Glucose, Bld: 107 mg/dL — ABNORMAL HIGH (ref 65–99)
Potassium: 3.6 mmol/L (ref 3.5–5.1)
SODIUM: 138 mmol/L (ref 135–145)
Total Bilirubin: 0.6 mg/dL (ref 0.3–1.2)
Total Protein: 6.9 g/dL (ref 6.5–8.1)

## 2017-01-09 LAB — LIPASE, BLOOD: LIPASE: 21 U/L (ref 11–51)

## 2017-01-09 NOTE — ED Triage Notes (Signed)
Pt reports abdominal pain, burping, gas for 2 years. Has been going to urgent care, has been taking pills, but they haven't been helping (doesn't remember what they were). The sx are all the time, and not just when eating.

## 2017-08-05 ENCOUNTER — Ambulatory Visit (HOSPITAL_COMMUNITY)
Admission: EM | Admit: 2017-08-05 | Discharge: 2017-08-05 | Disposition: A | Discharge status: Home / Self Care | Payer: Commercial Managed Care - PPO | Attending: Family Medicine | Admitting: Family Medicine

## 2017-08-05 ENCOUNTER — Encounter (HOSPITAL_COMMUNITY): Payer: Self-pay | Admitting: Emergency Medicine

## 2017-08-05 DIAGNOSIS — Z041 Encounter for examination and observation following transport accident: Secondary | ICD-10-CM

## 2017-08-05 NOTE — Discharge Instructions (Signed)
Exam was normal. If experiencing muscle soreness, use heat compresses. You can take Tylenol/Motrin for pain. Follow up with PCP for further evaluation or management needed.
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
## 2017-10-06 ENCOUNTER — Encounter: Payer: Self-pay | Admitting: Urgent Care

## 2017-10-06 ENCOUNTER — Ambulatory Visit (INDEPENDENT_AMBULATORY_CARE_PROVIDER_SITE_OTHER): Payer: Commercial Managed Care - PPO | Admitting: Urgent Care

## 2017-10-06 VITALS — BP 118/76 | HR 57 | Temp 98.6°F | Resp 16 | Ht 71.0 in | Wt 178.8 lb

## 2017-10-06 DIAGNOSIS — Z113 Encounter for screening for infections with a predominantly sexual mode of transmission: Secondary | ICD-10-CM

## 2017-10-06 DIAGNOSIS — Z0000 Encounter for general adult medical examination without abnormal findings: Secondary | ICD-10-CM | POA: Diagnosis not present

## 2017-10-06 DIAGNOSIS — Z23 Encounter for immunization: Secondary | ICD-10-CM

## 2017-10-06 NOTE — Patient Instructions (Addendum)
Health Maintenance, Male A healthy lifestyle and preventive care is important for your health and wellness. Ask your health care provider about what schedule of regular examinations is right for you. What should I know about weight and diet? Eat a Healthy Diet  Eat plenty of vegetables, fruits, whole grains, low-fat dairy products, and lean protein.  Do not eat a lot of foods high in solid fats, added sugars, or salt.  Maintain a Healthy Weight Regular exercise can help you achieve or maintain a healthy weight. You should:  Do at least 150 minutes of exercise each week. The exercise should increase your heart rate and make you sweat (moderate-intensity exercise).  Do strength-training exercises at least twice a week.  Watch Your Levels of Cholesterol and Blood Lipids  Have your blood tested for lipids and cholesterol every 5 years starting at 30 years of age. If you are at high risk for heart disease, you should start having your blood tested when you are 30 years old. You may need to have your cholesterol levels checked more often if: ? Your lipid or cholesterol levels are high. ? You are older than 30 years of age. ? You are at high risk for heart disease.  What should I know about cancer screening? Many types of cancers can be detected early and may often be prevented. Lung Cancer  You should be screened every year for lung cancer if: ? You are a current smoker who has smoked for at least 30 years. ? You are a former smoker who has quit within the past 15 years.  Talk to your health care provider about your screening options, when you should start screening, and how often you should be screened.  Colorectal Cancer  Routine colorectal cancer screening usually begins at 30 years of age and should be repeated every 5-10 years until you are 30 years old. You may need to be screened more often if early forms of precancerous polyps or small growths are found. Your health care provider  may recommend screening at an earlier age if you have risk factors for colon cancer.  Your health care provider may recommend using home test kits to check for hidden blood in the stool.  A small camera at the end of a tube can be used to examine your colon (sigmoidoscopy or colonoscopy). This checks for the earliest forms of colorectal cancer.  Prostate and Testicular Cancer  Depending on your age and overall health, your health care provider may do certain tests to screen for prostate and testicular cancer.  Talk to your health care provider about any symptoms or concerns you have about testicular or prostate cancer.  Skin Cancer  Check your skin from head to toe regularly.  Tell your health care provider about any new moles or changes in moles, especially if: ? There is a change in a mole's size, shape, or color. ? You have a mole that is larger than a pencil eraser.  Always use sunscreen. Apply sunscreen liberally and repeat throughout the day.  Protect yourself by wearing long sleeves, pants, a wide-brimmed hat, and sunglasses when outside.  What should I know about heart disease, diabetes, and high blood pressure?  If you are 18-39 years of age, have your blood pressure checked every 3-5 years. If you are 40 years of age or older, have your blood pressure checked every year. You should have your blood pressure measured twice-once when you are at a hospital or clinic, and once when   you are not at a hospital or clinic. Record the average of the two measurements. To check your blood pressure when you are not at a hospital or clinic, you can use: ? An automated blood pressure machine at a pharmacy. ? A home blood pressure monitor.  Talk to your health care provider about your target blood pressure.  If you are between 45-79 years old, ask your health care provider if you should take aspirin to prevent heart disease.  Have regular diabetes screenings by checking your fasting blood  sugar level. ? If you are at a normal weight and have a low risk for diabetes, have this test once every three years after the age of 45. ? If you are overweight and have a high risk for diabetes, consider being tested at a younger age or more often.  A one-time screening for abdominal aortic aneurysm (AAA) by ultrasound is recommended for men aged 65-75 years who are current or former smokers. What should I know about preventing infection? Hepatitis B If you have a higher risk for hepatitis B, you should be screened for this virus. Talk with your health care provider to find out if you are at risk for hepatitis B infection. Hepatitis C Blood testing is recommended for:  Everyone born from 1945 through 1965.  Anyone with known risk factors for hepatitis C.  Sexually Transmitted Diseases (STDs)  You should be screened each year for STDs including gonorrhea and chlamydia if: ? You are sexually active and are younger than 30 years of age. ? You are older than 30 years of age and your health care provider tells you that you are at risk for this type of infection. ? Your sexual activity has changed since you were last screened and you are at an increased risk for chlamydia or gonorrhea. Ask your health care provider if you are at risk.  Talk with your health care provider about whether you are at high risk of being infected with HIV. Your health care provider may recommend a prescription medicine to help prevent HIV infection.  What else can I do?  Schedule regular health, dental, and eye exams.  Stay current with your vaccines (immunizations).  Do not use any tobacco products, such as cigarettes, chewing tobacco, and e-cigarettes. If you need help quitting, ask your health care provider.  Limit alcohol intake to no more than 2 drinks per day. One drink equals 12 ounces of beer, 5 ounces of wine, or 1 ounces of hard liquor.  Do not use street drugs.  Do not share needles.  Ask your  health care provider for help if you need support or information about quitting drugs.  Tell your health care provider if you often feel depressed.  Tell your health care provider if you have ever been abused or do not feel safe at home. This information is not intended to replace advice given to you by your health care provider. Make sure you discuss any questions you have with your health care provider. Document Released: 05/26/2008 Document Revised: 07/27/2016 Document Reviewed: 09/01/2015 Elsevier Interactive Patient Education  2018 Elsevier Inc.     IF you received an x-ray today, you will receive an invoice from [facility]. Please contact  Radiology at [phone] with questions or concerns regarding your invoice.   IF you received labwork today, you will receive an invoice from [facility]. Please contact [facility] at [phone] with questions or concerns regarding your invoice.   Our billing staff will not be   able to assist you with questions regarding bills from these companies.  You will be contacted with the lab results as soon as they are available. The fastest way to get your results is to activate your My Chart account. Instructions are located on the last page of this paperwork. If you have not heard from us regarding the results in 2 weeks, please contact this office.       

## 2017-10-06 NOTE — Progress Notes (Signed)
MRN: [id number]  Subjective:   [name] is a 31 y.o. male presenting for annual physical exam. Patient is single, sexually active, would like STI screen. Has good relationships at home, has a good support network. Denies smoking cigarettes or drinking alcohol.   Medical care team includes: PCP: Patient, No Pcp Per Vision: No visual deficits. Dental: No dental care. Specialists: None.  Darryl Campbell is not currently taking any medications and has No Known Allergies.  Darryl Campbell  has a past medical history of Allergy. Denies past surgical history. Denies family history of cancer, diabetes, HTN, HL, heart disease, stroke, mental illness.   Immunizations: Updated tdap today, influenza was updated yesterday at his work.  Review of Systems  Constitutional: Negative for chills, diaphoresis, fever, malaise/fatigue and weight loss.  HENT: Negative for congestion, ear discharge, ear pain, hearing loss, nosebleeds, sore throat and tinnitus.   Eyes: Negative for blurred vision, double vision, photophobia, pain, discharge and redness.  Respiratory: Negative for cough, shortness of breath and wheezing.   Cardiovascular: Negative for chest pain, palpitations and leg swelling.  Gastrointestinal: Negative for abdominal pain, blood in stool, constipation, diarrhea, nausea and vomiting.  Genitourinary: Negative for dysuria, flank pain, frequency, hematuria and urgency.  Musculoskeletal: Negative for back pain, joint pain and myalgias.  Skin: Negative for itching and rash.  Neurological: Negative for dizziness, tingling, seizures, loss of consciousness, weakness and headaches.  Endo/Heme/Allergies: Negative for polydipsia.  Psychiatric/Behavioral: Negative for depression, hallucinations, memory loss, substance abuse and suicidal ideas. The patient is not nervous/anxious and does not have insomnia.     Objective:   Vitals: BP 118/76 (BP Location: Left Arm, Patient Position: Sitting, Cuff Size:  Normal)   Pulse (!) 57   Temp 98.6 F (37 C) (Oral)   Resp 16   Ht 5\' 11"  (1.803 m)   Wt 178 lb 12.8 oz (81.1 kg)   SpO2 96%   BMI 24.94 kg/m   Physical Exam  Constitutional: He is oriented to person, place, and time. He appears well-developed and well-nourished.  HENT:  TM's intact bilaterally, no effusions or erythema. Nasal turbinates pink and moist, nasal passages patent. No sinus tenderness. Oropharynx clear, mucous membranes moist, dentition in good repair.  Eyes: Pupils are equal, round, and reactive to light. Conjunctivae and EOM are normal. Right eye exhibits no discharge. Left eye exhibits no discharge. No scleral icterus.  Neck: Normal range of motion. Neck supple. No thyromegaly present.  Cardiovascular: Normal rate, regular rhythm and intact distal pulses.  Exam reveals no gallop and no friction rub.   No murmur heard. Pulmonary/Chest: No stridor. No respiratory distress. He has no wheezes. He has no rales.  Abdominal: Soft. Bowel sounds are normal. He exhibits no distension and no mass. There is no tenderness.  Musculoskeletal: Normal range of motion. He exhibits no edema or tenderness.  Lymphadenopathy:    He has no cervical adenopathy.  Neurological: He is alert and oriented to person, place, and time. He has normal reflexes. He displays normal reflexes. Coordination normal.  Skin: Skin is warm and dry. No rash noted. No erythema. No pallor.  Psychiatric: He has a normal mood and affect.   Assessment and Plan :   1. Annual physical exam - Discussed healthy lifestyle, diet, exercise, preventative care, vaccinations, and addressed patient's concerns.  - Comprehensive metabolic panel - TSH - Lipid panel - CBC with Differential/Platelet  2. Need for Tdap vaccination - Tdap vaccine greater than or equal to 7yo IM  3. Routine screening  for STI (sexually transmitted infection) - HIV antibody - RPR - GC/Chlamydia Probe Amp - Trichomonas vaginalis, RNA   Wallis Bamberg, PA-C [facility] [phone] 10/06/2017  11:30 AM

## 2017-10-07 LAB — LIPID PANEL
CHOL/HDL RATIO: 2.4 ratio (ref 0.0–5.0)
Cholesterol, Total: 153 mg/dL (ref 100–199)
HDL: 63 mg/dL (ref >39)
LDL Calculated: 79 mg/dL (ref 0–99)
Triglycerides: 53 mg/dL (ref 0–149)
VLDL Cholesterol Cal: 11 mg/dL (ref 5–40)

## 2017-10-07 LAB — CBC WITH DIFFERENTIAL/PLATELET
BASOS ABS: 0 10*3/uL (ref 0.0–0.2)
Basos: 0 %
EOS (ABSOLUTE): 0.1 10*3/uL (ref 0.0–0.4)
Eos: 3 %
HEMOGLOBIN: 13 g/dL (ref 13.0–17.7)
Hematocrit: 40.2 % (ref 37.5–51.0)
Immature Grans (Abs): 0 10*3/uL (ref 0.0–0.1)
Immature Granulocytes: 0 %
LYMPHS ABS: 1.3 10*3/uL (ref 0.7–3.1)
LYMPHS: 34 %
MCH: 22.7 pg — ABNORMAL LOW (ref 26.6–33.0)
MCHC: 32.3 g/dL (ref 31.5–35.7)
MCV: 70 fL — ABNORMAL LOW (ref 79–97)
MONOCYTES: 8 %
Monocytes Absolute: 0.3 10*3/uL (ref 0.1–0.9)
NEUTROS PCT: 55 %
Neutrophils Absolute: 2.1 10*3/uL (ref 1.4–7.0)
Platelets: 219 10*3/uL (ref 150–379)
RBC: 5.72 x10E6/uL (ref 4.14–5.80)
RDW: 15.8 % — ABNORMAL HIGH (ref 12.3–15.4)
WBC: 3.8 10*3/uL (ref 3.4–10.8)

## 2017-10-07 LAB — COMPREHENSIVE METABOLIC PANEL
A/G RATIO: 1.5 (ref 1.2–2.2)
ALBUMIN: 4.4 g/dL (ref 3.5–5.5)
ALT: 15 IU/L (ref 0–44)
AST: 26 IU/L (ref 0–40)
Alkaline Phosphatase: 53 IU/L (ref 39–117)
BILIRUBIN TOTAL: 0.7 mg/dL (ref 0.0–1.2)
BUN / CREAT RATIO: 15 (ref 9–20)
BUN: 17 mg/dL (ref 6–20)
CHLORIDE: 103 mmol/L (ref 96–106)
CO2: 24 mmol/L (ref 20–29)
Calcium: 9.7 mg/dL (ref 8.7–10.2)
Creatinine, Ser: 1.11 mg/dL (ref 0.76–1.27)
GFR calc non Af Amer: 89 mL/min/{1.73_m2} (ref >59)
GFR, EST AFRICAN AMERICAN: 103 mL/min/{1.73_m2} (ref >59)
Globulin, Total: 3 g/dL (ref 1.5–4.5)
Glucose: 83 mg/dL (ref 65–99)
POTASSIUM: 4.1 mmol/L (ref 3.5–5.2)
SODIUM: 140 mmol/L (ref 134–144)
TOTAL PROTEIN: 7.4 g/dL (ref 6.0–8.5)

## 2017-10-07 LAB — RPR: RPR: NONREACTIVE

## 2017-10-07 LAB — HIV ANTIBODY (ROUTINE TESTING W REFLEX): HIV SCREEN 4TH GENERATION: NONREACTIVE

## 2017-10-07 LAB — TSH: TSH: 0.685 u[IU]/mL (ref 0.450–4.500)

## 2018-03-01 DIAGNOSIS — M6282 Rhabdomyolysis: Secondary | ICD-10-CM | POA: Insufficient documentation

## 2018-05-09 ENCOUNTER — Encounter (HOSPITAL_COMMUNITY): Payer: Self-pay | Admitting: Emergency Medicine

## 2018-05-09 ENCOUNTER — Ambulatory Visit (HOSPITAL_COMMUNITY)
Admission: EM | Admit: 2018-05-09 | Discharge: 2018-05-09 | Disposition: A | Discharge status: Home / Self Care | Payer: Commercial Managed Care - PPO | Attending: Family Medicine | Admitting: Family Medicine

## 2018-05-09 DIAGNOSIS — L739 Follicular disorder, unspecified: Secondary | ICD-10-CM | POA: Diagnosis not present

## 2018-05-09 MED ORDER — DOXYCYCLINE HYCLATE 100 MG PO TABS: 100.0000 mg | ORAL_TABLET | Freq: Two times a day (BID) | ORAL | 0 refills | Status: DC

## 2018-05-09 NOTE — ED Provider Notes (Signed)
[facility]   [id number] 05/09/18 Arrival Time: 1528   SUBJECTIVE:  Darryl Campbell is a 32 y.o. male who presents to the urgent care with complaint of groin pain and itching.  This began over the last two days.  The papules are scattered and in the left inguinal and right medal proximal thigh areas.  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   He has never had this before.  Past Medical History:  Diagnosis Date  . Allergy    Family History  Problem Relation Age of Onset  . Cancer Neg Hx   . Diabetes Neg Hx   . Heart failure Neg Hx   . Hyperlipidemia Neg Hx    Social History   Socioeconomic History  . Marital status: Single    Spouse name: n/a  . Number of children: 0  . Years of education: 12th grade  . Highest education level: Not on file  Occupational History  . Occupation: Location manager  Social Needs  . Financial resource strain: Not on file  . Food insecurity:    Worry: Not on file    Inability: Not on file  . Transportation needs:    Medical: Not on file    Non-medical: Not on file  Tobacco Use  . Smoking status: Never Smoker  . Smokeless tobacco: Never Used  Substance and Sexual Activity  . Alcohol use: No    Alcohol/week: 0.0 oz  . Drug use: No  . Sexual activity: Yes    Partners: Female  Lifestyle  . Physical activity:    Days per week: Not on file    Minutes per session: Not on file  . Stress: Not on file  Relationships  . Social connections:    Talks on phone: Not on file    Gets together: Not on file    Attends religious service: Not on file    Active member of club or organization: Not on file    Attends meetings of clubs or organizations: Not on file    Relationship status: Not on file  . Intimate partner violence:    Fear of current or ex partner: Not on file    Emotionally abused: Not on file    Physically abused: Not on file    Forced sexual activity: Not on file  Other Topics Concern  . Not on file    Social History Narrative   Lives with his parents.   Born in [place]. Came to the [place] age 45.   No outpatient medications have been marked as taking for the 05/09/18 encounter [facility] Encounter).   No Known Allergies    ROS: As per HPI, remainder of ROS negative.   OBJECTIVE:   Vitals:   05/09/18 1553  BP: (!) 165/99  Pulse: (!) 59  Resp: 18  Temp: 98.5 F (36.9 C)  TempSrc: Oral  SpO2: 100%     General appearance: alert; no distress Eyes: PERRL; EOMI; conjunctiva normal HENT: normocephalic; atraumatic;  normal Neck: supple Extremities: no cyanosis or edema; symmetrical with no gross deformities Skin: warm and dry; several papules varying in size from 1 mm to 5 mm which are tender in the left groin and consistent with a folliculitis.  There are several 1 mm scaly papules in the proximal medial right thigh Neurologic: normal gait; grossly normal Psychological: alert and cooperative; normal mood and affect      Labs:  Results for orders placed or performed in visit on 10/06/17  Comprehensive  metabolic panel  Result Value Ref Range   Glucose 83 65 - 99 mg/dL   BUN 17 6 - 20 mg/dL   Creatinine, Ser 6.04 0.76 - 1.27 mg/dL   GFR calc non Af Amer 89 >59 mL/min/1.73   GFR calc Af Amer 103 >59 mL/min/1.73   BUN/Creatinine Ratio 15 9 - 20   Sodium 140 134 - 144 mmol/L   Potassium 4.1 3.5 - 5.2 mmol/L   Chloride 103 96 - 106 mmol/L   CO2 24 20 - 29 mmol/L   Calcium 9.7 8.7 - 10.2 mg/dL   Total Protein 7.4 6.0 - 8.5 g/dL   Albumin 4.4 3.5 - 5.5 g/dL   Globulin, Total 3.0 1.5 - 4.5 g/dL   Albumin/Globulin Ratio 1.5 1.2 - 2.2   Bilirubin Total 0.7 0.0 - 1.2 mg/dL   Alkaline Phosphatase 53 39 - 117 IU/L   AST 26 0 - 40 IU/L   ALT 15 0 - 44 IU/L  TSH  Result Value Ref Range   TSH 0.685 0.450 - 4.500 uIU/mL  Lipid panel  Result Value Ref Range   Cholesterol, Total 153 100 - 199 mg/dL   Triglycerides 53 0 - 149 mg/dL   HDL 63 >54 mg/dL   VLDL Cholesterol Cal 11 5  - 40 mg/dL   LDL Calculated 79 0 - 99 mg/dL   Chol/HDL Ratio 2.4 0.0 - 5.0 ratio  CBC with Differential/Platelet  Result Value Ref Range   WBC 3.8 3.4 - 10.8 x10E3/uL   RBC 5.72 4.14 - 5.80 x10E6/uL   Hemoglobin 13.0 13.0 - 17.7 g/dL   Hematocrit 09.8 11.9 - 51.0 %   MCV 70 (L) 79 - 97 fL   MCH 22.7 (L) 26.6 - 33.0 pg   MCHC 32.3 31.5 - 35.7 g/dL   RDW 14.7 (H) 82.9 - 56.2 %   Platelets 219 150 - 379 x10E3/uL   Neutrophils 55 Not Estab. %   Lymphs 34 Not Estab. %   Monocytes 8 Not Estab. %   Eos 3 Not Estab. %   Basos 0 Not Estab. %   Neutrophils Absolute 2.1 1.4 - 7.0 x10E3/uL   Lymphocytes Absolute 1.3 0.7 - 3.1 x10E3/uL   Monocytes Absolute 0.3 0.1 - 0.9 x10E3/uL   EOS (ABSOLUTE) 0.1 0.0 - 0.4 x10E3/uL   Basophils Absolute 0.0 0.0 - 0.2 x10E3/uL   Immature Granulocytes 0 Not Estab. %   Immature Grans (Abs) 0.0 0.0 - 0.1 x10E3/uL  HIV antibody  Result Value Ref Range   HIV Screen 4th Generation wRfx Non Reactive Non Reactive  RPR  Result Value Ref Range   RPR Ser Ql Non Reactive Non Reactive    Labs Reviewed - No data to display  No results found.     ASSESSMENT & PLAN:  1. Folliculitis     Meds ordered this encounter  Medications  . doxycycline (VIBRA-TABS) 100 MG tablet    Sig: Take 1 tablet (100 mg total) by mouth 2 (two) times daily.    Dispense:  20 tablet    Refill:  0    Reviewed expectations re: course of current medical issues. Questions answered. Outlined signs and symptoms indicating need for more acute intervention. Patient verbalized understanding. After Visit Summary given.    Procedures:      Elvina Sidle, MD 05/09/18 1625

## 2018-05-09 NOTE — ED Triage Notes (Signed)
Pt sts groin pain and itching

## 2018-07-23 ENCOUNTER — Ambulatory Visit (HOSPITAL_COMMUNITY)
Admission: EM | Admit: 2018-07-23 | Discharge: 2018-07-23 | Disposition: A | Discharge status: Home / Self Care | Payer: Commercial Managed Care - PPO | Attending: Family Medicine | Admitting: Family Medicine

## 2018-07-23 ENCOUNTER — Encounter (HOSPITAL_COMMUNITY): Payer: Self-pay | Admitting: Emergency Medicine

## 2018-07-23 DIAGNOSIS — R21 Rash and other nonspecific skin eruption: Secondary | ICD-10-CM | POA: Diagnosis not present

## 2018-07-23 MED ORDER — CLOTRIMAZOLE-BETAMETHASONE 1-0.05 % EX CREA: 1.0000 "application " | TOPICAL_CREAM | Freq: Two times a day (BID) | CUTANEOUS | 0 refills | Status: DC

## 2018-07-23 MED ORDER — DOXYCYCLINE HYCLATE 100 MG PO TABS: 100.0000 mg | ORAL_TABLET | Freq: Two times a day (BID) | ORAL | 0 refills | Status: DC

## 2018-07-23 NOTE — Discharge Instructions (Addendum)
Use the cream until the rash and itching disappear Take the antibiotic as directed Follow up as needed

## 2018-07-23 NOTE — ED Triage Notes (Signed)
Pt sts rash in genital area that is itchy

## 2018-07-23 NOTE — ED Provider Notes (Signed)
[facility]    CSN: [id number] Arrival date & time: 07/23/18  1614     History   Chief Complaint Chief Complaint  Patient presents with  . Rash    HPI Darryl Campbell is a 32 y.o. male.   HPI  Rash  In "groin" area No dysuria or discharge No new contacts Rash itches, no pain No abdominal pain or fever Does not think exposed to STD Perspires a lot at work  Past Medical History:  Diagnosis Date  . Allergy     There are no active problems to display for this patient.   History reviewed. No pertinent surgical history.     Home Medications    Prior to Admission medications   Medication Sig Start Date End Date Taking? Authorizing Provider  clotrimazole-betamethasone (LOTRISONE) cream Apply 1 application topically 2 (two) times daily. 07/23/18   Eustace MooreNelson, Yvonne Sue, MD  doxycycline (VIBRA-TABS) 100 MG tablet Take 1 tablet (100 mg total) by mouth 2 (two) times daily. 07/23/18   Eustace MooreNelson, Yvonne Sue, MD    Family History Family History  Problem Relation Age of Onset  . Cancer Neg Hx   . Diabetes Neg Hx   . Heart failure Neg Hx   . Hyperlipidemia Neg Hx     Social History Social History   Tobacco Use  . Smoking status: Never Smoker  . Smokeless tobacco: Never Used  Substance Use Topics  . Alcohol use: No    Alcohol/week: 0.0 standard drinks  . Drug use: No     Allergies   Patient has no known allergies.   Review of Systems Review of Systems  Constitutional: Negative for chills and fever.  HENT: Negative for ear pain and sore throat.   Eyes: Negative for pain and visual disturbance.  Respiratory: Negative for cough and shortness of breath.   Cardiovascular: Negative for chest pain and palpitations.  Gastrointestinal: Negative for abdominal pain and vomiting.  Genitourinary: Negative for discharge, dysuria, hematuria and testicular pain.  Musculoskeletal: Negative for arthralgias and back pain.  Skin: Positive for rash. Negative for color  change.  Neurological: Negative for seizures and syncope.  All other systems reviewed and are negative.    Physical Exam Triage Vital Signs ED Triage Vitals [07/23/18 1726]  Enc Vitals Group     BP (!) 180/99     Pulse Rate 60     Resp 18     Temp 98.5 F (36.9 C)     Temp Source Oral     SpO2 100 %     Weight      Height      Head Circumference      Peak Flow      Pain Score      Pain Loc      Pain Edu?      Excl. in GC?    No data found.  Updated Vital Signs BP (!) 180/99 (BP Location: Left Arm)   Pulse 60   Temp 98.5 F (36.9 C) (Oral)   Resp 18   SpO2 100%   Visual Acuity Right Eye Distance:   Left Eye Distance:   Bilateral Distance:    Right Eye Near:   Left Eye Near:    Bilateral Near:     Physical Exam  Constitutional: He appears well-developed and well-nourished. No distress.  HENT:  Head: Normocephalic and atraumatic.  Mouth/Throat: Oropharynx is clear and moist.  Eyes: Pupils are equal, round, and reactive to light. Conjunctivae are  normal.  Neck: Normal range of motion.  Cardiovascular: Normal rate.  Pulmonary/Chest: Effort normal. No respiratory distress.  Abdominal: Soft. He exhibits no distension.  Genitourinary: Penis normal.  Genitourinary Comments: Archie PattenMons has a fine scale area, no ulcerations or open areas.  +inguinal shotty nodes, few infected follicles  Musculoskeletal: Normal range of motion. He exhibits no edema.  Neurological: He is alert.  Skin: Skin is warm and dry.  Psychiatric: He has a normal mood and affect. His behavior is normal.     UC Treatments / Results  Labs (all labs ordered are listed, but only abnormal results are displayed) Labs Reviewed - No data to display  EKG None  Radiology No results found.  Procedures Procedures (including critical care time)  Medications Ordered in UC Medications - No data to display  Initial Impression / Assessment and Plan / UC Course  I have reviewed the triage vital  signs and the nursing notes.  Pertinent labs & imaging results that were available during my care of the patient were reviewed by me and considered in my medical decision making (see chart for details).      Final Clinical Impressions(s) / UC Diagnoses   Final diagnoses:  Rash     Discharge Instructions     Use the cream until the rash and itching disappear Take the antibiotic as directed Follow up as needed    ED Prescriptions    Medication Sig Dispense Auth. Provider   doxycycline (VIBRA-TABS) 100 MG tablet Take 1 tablet (100 mg total) by mouth 2 (two) times daily. 20 tablet Eustace MooreNelson, Yvonne Sue, MD   clotrimazole-betamethasone (LOTRISONE) cream Apply 1 application topically 2 (two) times daily. 30 g Eustace MooreNelson, Yvonne Sue, MD     Controlled Substance Prescriptions [place] Controlled Substance Registry consulted? Not Applicable   Eustace MooreNelson, Yvonne Sue, MD 07/23/18 1753

## 2019-02-07 ENCOUNTER — Encounter (HOSPITAL_COMMUNITY): Payer: Self-pay | Admitting: Emergency Medicine

## 2019-02-07 ENCOUNTER — Ambulatory Visit (HOSPITAL_COMMUNITY)
Admission: EM | Admit: 2019-02-07 | Discharge: 2019-02-07 | Disposition: A | Discharge status: Home / Self Care | Payer: Commercial Managed Care - PPO | Attending: Family Medicine | Admitting: Family Medicine

## 2019-02-07 DIAGNOSIS — R51 Headache: Secondary | ICD-10-CM

## 2019-02-07 DIAGNOSIS — I10 Essential (primary) hypertension: Secondary | ICD-10-CM | POA: Diagnosis not present

## 2019-02-07 DIAGNOSIS — R519 Headache, unspecified: Secondary | ICD-10-CM

## 2019-02-07 MED ORDER — AMLODIPINE BESYLATE 5 MG PO TABS: 5.0000 mg | ORAL_TABLET | Freq: Every day | ORAL | 0 refills | Status: DC

## 2019-02-07 MED ORDER — KETOROLAC TROMETHAMINE 30 MG/ML IJ SOLN
INTRAMUSCULAR | Status: AC
  Filled 2019-02-07: qty 1

## 2019-02-07 MED ORDER — KETOROLAC TROMETHAMINE 30 MG/ML IJ SOLN
30.0000 mg | Freq: Once | INTRAMUSCULAR | Status: AC
  Administered 2019-02-07: 30 mg via INTRAMUSCULAR

## 2019-02-07 NOTE — ED Triage Notes (Signed)
PT C/O: intermittent HA onset 5 days d/t HTN.... reports BP at [facility] was 190 sys/127 diastolic ???  DENIES: n/v/d  TAKING MEDS: none  A&O x4... NAD... Ambulatory

## 2019-02-07 NOTE — ED Provider Notes (Signed)
[facility]    CSN: [id number] Arrival date & time: 02/07/19  1525     History   Chief Complaint Chief Complaint  Patient presents with  . Headache  . Hypertension    HPI Darryl Campbell is a 33 y.o. male.   Presenting with headache and high blood pressure.  He checked his blood pressure at the local store and it was elevated.  He denies any chest pain or shortness of breath.  Denies any leg swelling.  He has not ever taken medication for blood pressure before.  His headache has been ongoing for 2 days.  Initially started on Sunday and resolved on its own.  He has hadheadaches before that but they always resolved.  It is frontal in nature.  Denies any changes in his vision.  Does not have any auras.  No recent illness or trauma to his head.  Has not tried any medications.  He works at a [employer].  HPI  Past Medical History:  Diagnosis Date  . Allergy     There are no active problems to display for this patient.   History reviewed. No pertinent surgical history.     Home Medications    Prior to Admission medications   Medication Sig Start Date End Date Taking? Authorizing Provider  amLODipine (NORVASC) 5 MG tablet Take 1 tablet (5 mg total) by mouth daily. 02/07/19   Myra Rude, MD  clotrimazole-betamethasone (LOTRISONE) cream Apply 1 application topically 2 (two) times daily. 07/23/18   Eustace Moore, MD  doxycycline (VIBRA-TABS) 100 MG tablet Take 1 tablet (100 mg total) by mouth 2 (two) times daily. 07/23/18   Eustace Moore, MD    Family History Family History  Problem Relation Age of Onset  . Cancer Neg Hx   . Diabetes Neg Hx   . Heart failure Neg Hx   . Hyperlipidemia Neg Hx     Social History Social History   Tobacco Use  . Smoking status: Never Smoker  . Smokeless tobacco: Never Used  Substance Use Topics  . Alcohol use: No    Alcohol/week: 0.0 standard drinks  . Drug use: No     Allergies   Patient has no known  allergies.   Review of Systems Review of Systems  Constitutional: Negative for fever.  HENT: Negative for congestion.   Eyes: Negative for photophobia.  Respiratory: Negative for cough.   Cardiovascular: Negative for chest pain and leg swelling.  Gastrointestinal: Negative for abdominal pain.  Musculoskeletal: Negative for back pain.  Skin: Negative for color change.  Neurological: Positive for headaches.  Hematological: Negative for adenopathy.  Psychiatric/Behavioral: Negative for agitation.     Physical Exam Triage Vital Signs ED Triage Vitals  Enc Vitals Group     BP 02/07/19 1623 (!) 158/84     Pulse Rate 02/07/19 1623 65     Resp 02/07/19 1623 16     Temp 02/07/19 1623 98.3 F (36.8 C)     Temp Source 02/07/19 1623 Oral     SpO2 02/07/19 1623 99 %     Weight --      Height --      Head Circumference --      Peak Flow --      Pain Score 02/07/19 1624 10     Pain Loc --      Pain Edu? --      Excl. in GC? --    No data found.  Updated Vital  Signs BP (!) 158/84 (BP Location: Right Arm)   Pulse 65   Temp 98.3 F (36.8 C) (Oral)   Resp 16   SpO2 99%   Visual Acuity Right Eye Distance:   Left Eye Distance:   Bilateral Distance:    Right Eye Near:   Left Eye Near:    Bilateral Near:     Physical Exam Gen: NAD, alert, cooperative with exam,  ENT: normal lips, normal nasal mucosa,  Eye: normal EOM, normal conjunctiva and lids, CV:  no edema, +2 pedal pulses   Resp: no accessory muscle use, non-labored, clear to auscultation bilaterally, no crackles or wheezes Skin: no rashes, no areas of induration  Neuro: normal tone, normal sensation to touch, cranial nerves II through XII intact Psych:  normal insight, alert and oriented MSK: normal gait, normal strength   UC Treatments / Results  Labs (all labs ordered are listed, but only abnormal results are displayed) Labs Reviewed - No data to display  EKG None  Radiology No results  found.  Procedures Procedures (including critical care time)  Medications Ordered in UC Medications  ketorolac (TORADOL) 30 MG/ML injection 30 mg (30 mg Intramuscular Given 02/07/19 1658)    Initial Impression / Assessment and Plan / UC Course  I have reviewed the triage vital signs and the nursing notes.  Pertinent labs & imaging results that were available during my care of the patient were reviewed by me and considered in my medical decision making (see chart for details).     Darryl Campbell is a 33 year old male that is presenting with headache and elevated blood pressure.  He reports his blood pressures been elevated.  Possible that the headache to be associated with his elevated blood pressure.  It is frontal in nature.  No deficits on exam.  Not a suggestion of infection.  Has not tried any medications.  Will provide a Toradol injection.  Sent in amlodipine if his blood pressure tends to continue to be elevated and counseled on what is above normal.  Advised to follow-up with primary care.  Final Clinical Impressions(s) / UC Diagnoses   Final diagnoses:  Hypertension, unspecified type  Acute nonintractable headache, unspecified headache type     Discharge Instructions     Please try checking your blood pressure.  Please take the amlodipine if your blood pressure is 140/90  You can take Excedrin or ibuprofen for your headache.  Please follow up if your symptoms fail to improve.     ED Prescriptions    Medication Sig Dispense Auth. Provider   amLODipine (NORVASC) 5 MG tablet Take 1 tablet (5 mg total) by mouth daily. 30 tablet Myra Rude, MD     Controlled Substance Prescriptions [place] Controlled Substance Registry consulted? Not Applicable   Myra Rude, MD 02/07/19 [phone]

## 2019-02-07 NOTE — Discharge Instructions (Signed)
Please try checking your blood pressure.  Please take the amlodipine if your blood pressure is 140/90  You can take Excedrin or ibuprofen for your headache.  Please follow up if your symptoms fail to improve.

## 2019-06-18 ENCOUNTER — Ambulatory Visit (INDEPENDENT_AMBULATORY_CARE_PROVIDER_SITE_OTHER): Payer: Self-pay | Admitting: Family Medicine

## 2019-06-18 ENCOUNTER — Encounter: Payer: Self-pay | Admitting: Family Medicine

## 2019-06-18 ENCOUNTER — Other Ambulatory Visit: Payer: Self-pay

## 2019-06-18 DIAGNOSIS — Z7689 Persons encountering health services in other specified circumstances: Secondary | ICD-10-CM

## 2019-06-18 DIAGNOSIS — M545 Low back pain, unspecified: Secondary | ICD-10-CM

## 2019-06-18 DIAGNOSIS — I10 Essential (primary) hypertension: Secondary | ICD-10-CM

## 2019-06-18 NOTE — Progress Notes (Signed)
Virtual Visit via Video Note Visit started via doxy, however call completed via phone 2/2 connectivity issues. I connected with Darryl Campbell on 06/18/19 at  2:00 PM EDT by a video enabled telemedicine application and verified that I am speaking with the correct person using two identifiers.  Location patient: home Location provider:work or home office Persons participating in the virtual visit: patient, provider  I discussed the limitations of evaluation and management by telemedicine and the availability of in person appointments. The patient expressed understanding and agreed to proceed.   HPI: Pt is a 33 yo male with pmh sig for HTN.  Pt states he was going [facility] treatment for a work related back injury. Next appointment 06/19/19.  Back pain: -work injury 06/03/19, workman's comp involved. 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  Had difficulty bending over after, told supervisor -seen at [place].  Initially told to return to work.  Since given a note to be out x 1 wk.  -Given meds that make him sleepy.  Gabapentin 300 mg, prednisolone, tramadol, hydrocodone,  -Pt states "I'm in pain and cannot be by myself".  Staying with parents.  Can't sleep at night.   -Has pain when sitting down. -pain is in his waist, pain is 8/10 or worse. The pain does go into his legs.  Using a cane for ambulation - Denies numbness or tingling in legs. -has f/u appt tomorrow -states has an MRI scheduled  HTN?: -per pt he does not have issues with bp -states bp goes up if exercises or eats fast food.  No longer eating fast food, cooking more.  Stopped drinking sodas.  Drinking water. -states only took bp meds x 1 wk -states if gets a migraine that's when he knows his bp is up. -not checking bp.    Allergies: NKDA  Past surgical hx: none  Social Hx:  Pt originally born in the [place], but was raised in [place].  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  Pt denies EtOH, tobacco, and drug use.   ROS: See pertinent positives and negatives per HPI.  Past Medical History:  Diagnosis Date  . Allergy     No past surgical history on file.  Family History  Problem Relation Age of Onset  . Cancer Neg Hx   . Diabetes Neg Hx   . Heart failure Neg Hx   . Hyperlipidemia Neg Hx     Current Outpatient Medications:  .  amLODipine (NORVASC) 5 MG tablet, Take 1 tablet (5 mg total) by mouth daily., Disp: 30 tablet, Rfl: 0 .  clotrimazole-betamethasone (LOTRISONE) cream, Apply 1 application topically 2 (two) times daily., Disp: 30 g, Rfl: 0 .  doxycycline (VIBRA-TABS) 100 MG tablet, Take 1 tablet (100 mg total) by mouth 2 (two) times daily., Disp: 20 tablet, Rfl: 0  EXAM:  VITALS per patient if applicable:  RR between 12-20 bpm  GENERAL: alert, oriented, appears well and in no acute distress  HEENT: atraumatic, conjunctiva clear, no obvious abnormalities on inspection of external nose and ears  NECK: normal movements of the head and neck  LUNGS: on inspection no signs of respiratory distress, breathing rate appears normal, no obvious gross SOB, gasping or wheezing  CV: no obvious cyanosis  MS: moves all visible extremities without noticeable abnormality  PSYCH/NEURO: pleasant and cooperative, no obvious depression or  anxiety, speech and thought processing grossly intact  ASSESSMENT AND PLAN:  Discussed the following assessment and plan:  Acute bilateral low back pain without sciatica  -pt encouraged to keep f/u appt with workman's comp provider -continue current meds -will likely benefit from MRI and PT.  Essential hypertension  -not currently taking meds -discussed pain can cause elevation in bp -discussed obtaining a bp cuff for home use.  Pt advised to keep a log -discussed health risk a/w HTN -lifestyle modifications encouraged -pt to f/u in 2-4 wks for  bp  Encounter to establish care  -We reviewed the PMH, PSH, FH, SH, Meds and Allergies. -We provided refills for any medications we will prescribe as needed. -We addressed current concerns per orders and patient instructions. -We have asked for records for pertinent exams, studies, vaccines and notes from previous providers. -We have advised patient to follow up per instructions below.  F/u prn in the next few wks    I discussed the assessment and treatment plan with the patient. The patient was provided an opportunity to ask questions and all were answered. The patient agreed with the plan and demonstrated an understanding of the instructions.   The patient was advised to call back or seek an in-person evaluation if the symptoms worsen or if the condition fails to improve as anticipated.  I provided 28 minutes of non-face-to-face time during this encounter.   Deeann SaintShannon R Jalaila Caradonna, MD

## 2019-06-20 ENCOUNTER — Telehealth: Payer: Self-pay

## 2019-06-20 NOTE — Telephone Encounter (Signed)
Called pt states that he only wants to speak with Dr Volanda Napoleon and no one else    Copied from [place] [id number]. Topic: General - Other >> Jun 19, 2019  4:41 PM Mcneil, Ja-Kwan wrote: Reason for CRM: Pt stated he needs to speak with Dr. Volanda Napoleon regarding a situation with his job. Pt requests call back.

## 2019-06-21 ENCOUNTER — Telehealth: Payer: Self-pay | Admitting: Family Medicine

## 2019-06-21 NOTE — Telephone Encounter (Signed)
Called returned by this provider.  Pt wanted to update this provider on his back pain.  Pt states he saw the Workman's comp UC provider for f/u.  Pt states he cannot go to work/drive as the meds he is on make him sleepy and dizzy.  Pt tried to tell his job this, but they are insisting he return to work.  Pt asked his job to provide transportation but they declines.  Pt was unable to go to work today 2/2 pain and sleepiness.  States he got an MRI today.  Given a disc of images to take to [facility] comp provider.  UC has yet to review the images.  Pt is pursuing legal advice regarding his workman's comp claim.   Grier Mitts, MD

## 2019-06-21 NOTE — Telephone Encounter (Signed)
Pt's call returned by this provider.  Telephone encounter made.

## 2020-07-29 ENCOUNTER — Other Ambulatory Visit: Payer: Self-pay

## 2020-07-29 ENCOUNTER — Ambulatory Visit (HOSPITAL_COMMUNITY)
Admission: EM | Admit: 2020-07-29 | Discharge: 2020-07-29 | Disposition: A | Discharge status: Home / Self Care | Payer: Self-pay | Attending: Urgent Care | Admitting: Urgent Care

## 2020-07-29 ENCOUNTER — Encounter (HOSPITAL_COMMUNITY): Payer: Self-pay

## 2020-07-29 DIAGNOSIS — R509 Fever, unspecified: Secondary | ICD-10-CM

## 2020-07-29 DIAGNOSIS — Z79899 Other long term (current) drug therapy: Secondary | ICD-10-CM | POA: Insufficient documentation

## 2020-07-29 DIAGNOSIS — R030 Elevated blood-pressure reading, without diagnosis of hypertension: Secondary | ICD-10-CM | POA: Insufficient documentation

## 2020-07-29 DIAGNOSIS — U071 COVID-19: Secondary | ICD-10-CM | POA: Insufficient documentation

## 2020-07-29 DIAGNOSIS — I10 Essential (primary) hypertension: Secondary | ICD-10-CM | POA: Insufficient documentation

## 2020-07-29 DIAGNOSIS — R5383 Other fatigue: Secondary | ICD-10-CM

## 2020-07-29 DIAGNOSIS — R059 Cough, unspecified: Secondary | ICD-10-CM

## 2020-07-29 DIAGNOSIS — K219 Gastro-esophageal reflux disease without esophagitis: Secondary | ICD-10-CM | POA: Insufficient documentation

## 2020-07-29 DIAGNOSIS — J069 Acute upper respiratory infection, unspecified: Secondary | ICD-10-CM

## 2020-07-29 DIAGNOSIS — R05 Cough: Secondary | ICD-10-CM

## 2020-07-29 DIAGNOSIS — R630 Anorexia: Secondary | ICD-10-CM

## 2020-07-29 LAB — SARS CORONAVIRUS 2 (TAT 6-24 HRS): SARS Coronavirus 2: POSITIVE — AB

## 2020-07-29 MED ORDER — FAMOTIDINE 20 MG PO TABS: 20.0000 mg | ORAL_TABLET | Freq: Two times a day (BID) | ORAL | 0 refills | Status: DC

## 2020-07-29 MED ORDER — ACETAMINOPHEN 325 MG PO TABS
ORAL_TABLET | ORAL | Status: AC
  Filled 2020-07-29: qty 2

## 2020-07-29 MED ORDER — BENZONATATE 100 MG PO CAPS: 100.0000 mg | ORAL_CAPSULE | Freq: Three times a day (TID) | ORAL | 0 refills | Status: DC | PRN

## 2020-07-29 MED ORDER — CETIRIZINE HCL 10 MG PO TABS: 10.0000 mg | ORAL_TABLET | Freq: Every day | ORAL | 0 refills | Status: DC

## 2020-07-29 MED ORDER — PROMETHAZINE-DM 6.25-15 MG/5ML PO SYRP: 5.0000 mL | ORAL_SOLUTION | Freq: Every evening | ORAL | 0 refills | Status: DC | PRN

## 2020-07-29 MED ORDER — ACETAMINOPHEN 325 MG PO TABS
650.0000 mg | ORAL_TABLET | Freq: Once | ORAL | Status: AC
  Administered 2020-07-29: 650 mg via ORAL

## 2020-07-29 NOTE — ED Triage Notes (Signed)
Pt presents with cough, fever and fatigue x 2 days.

## 2020-07-29 NOTE — ED Provider Notes (Signed)
[facility]   MRN: [id number] DOB: [birth year]  Subjective:   Aleric Froelich is a 34 y.o. male presenting for 2-day history of bothersome cough, fever, fatigue.  Has not used medications for relief. Has not had COVID 19, no COVID vaccination.  He is currently doing online schooling, is not working.  Has a history of high blood pressure but patient denies this and does not want to be on medication.  He does report having trouble with burping and fullness, feeling like his food is not passing through well after he eats.  Denies bloody stools, vomiting, chest pain, shortness of breath, wheezing, sore throat, ear pain, body aches.  No current facility-administered medications for this encounter.  Current Outpatient Medications:  .  amLODipine (NORVASC) 5 MG tablet, Take 1 tablet (5 mg total) by mouth daily., Disp: 30 tablet, Rfl: 0 .  clotrimazole-betamethasone (LOTRISONE) cream, Apply 1 application topically 2 (two) times daily., Disp: 30 g, Rfl: 0 .  doxycycline (VIBRA-TABS) 100 MG tablet, Take 1 tablet (100 mg total) by mouth 2 (two) times daily., Disp: 20 tablet, Rfl: 0   No Known Allergies  Past Medical History:  Diagnosis Date  . Allergy      History reviewed. No pertinent surgical history.  Family History  Problem Relation Age of Onset  . Cancer Neg Hx   . Diabetes Neg Hx   . Heart failure Neg Hx   . Hyperlipidemia Neg Hx     Social History   Tobacco Use  . Smoking status: Never Smoker  . Smokeless tobacco: Never Used  Substance Use Topics  . Alcohol use: No    Alcohol/week: 0.0 standard drinks  . Drug use: No    ROS   Objective:   Vitals: BP (!) 150/76 (BP Location: Right Arm)   Pulse 70   Temp (!) 101.4 F (38.6 C) (Oral)   Resp 18   SpO2 99%   BP Readings from Last 3 Encounters:  07/29/20 (!) 150/76  02/07/19 (!) 158/84  07/23/18 (!) 180/99   Physical Exam Constitutional:      General: He is not in acute distress.    Appearance: Normal  appearance. He is well-developed. He is not ill-appearing, toxic-appearing or diaphoretic.  HENT:     Head: Normocephalic and atraumatic.     Right Ear: External ear normal.     Left Ear: External ear normal.     Nose: Nose normal.     Mouth/Throat:     Mouth: Mucous membranes are moist.     Pharynx: Oropharynx is clear.  Eyes:     General: No scleral icterus.       Right eye: No discharge.        Left eye: No discharge.     Extraocular Movements: Extraocular movements intact.     Conjunctiva/sclera: Conjunctivae normal.     Pupils: Pupils are equal, round, and reactive to light.  Cardiovascular:     Rate and Rhythm: Normal rate and regular rhythm.     Heart sounds: Normal heart sounds. No murmur heard.  No friction rub. No gallop.   Pulmonary:     Effort: Pulmonary effort is normal. No respiratory distress.     Breath sounds: Normal breath sounds. No stridor. No wheezing, rhonchi or rales.  Neurological:     Mental Status: He is alert and oriented to person, place, and time.  Psychiatric:        Mood and Affect: Mood normal.  Behavior: Behavior normal.        Thought Content: Thought content normal.        Judgment: Judgment normal.      Assessment and Plan :   PDMP not reviewed this encounter.  1. Viral URI with cough   2. Cough   3. Fever, unspecified   4. Fatigue, unspecified type   5. Decreased appetite   6. Essential hypertension   7. Elevated blood pressure reading   8. Gastroesophageal reflux disease, unspecified whether esophagitis present     Will manage for viral illness such as viral URI, viral syndrome, viral rhinitis, COVID-19. Counseled patient on nature of COVID-19 including modes of transmission, diagnostic testing, management and supportive care.  Offered scripts for symptomatic relief. COVID 19 testing is pending.  Offered patient medication refill and confirmed that he in fact does have high blood pressure but patient refuses.  Reviewed  possibility of GERD, need for diet review to avoid GERD causing foods.  Start Pepcid as needed.  Counseled patient on potential for adverse effects with medications prescribed/recommended today, ER and return-to-clinic precautions discussed, patient verbalized understanding.     Wallis Bamberg, PA-C 07/29/20 1354

## 2020-07-29 NOTE — Discharge Instructions (Addendum)

## 2024-01-07 ENCOUNTER — Other Ambulatory Visit: Payer: Self-pay

## 2024-01-07 ENCOUNTER — Emergency Department (HOSPITAL_COMMUNITY)
Admission: EM | Admit: 2024-01-07 | Discharge: 2024-01-07 | Disposition: A | Discharge status: Home / Self Care | Payer: BC Managed Care – PPO | Attending: Emergency Medicine | Admitting: Emergency Medicine

## 2024-01-07 ENCOUNTER — Emergency Department (HOSPITAL_COMMUNITY): Payer: BC Managed Care – PPO

## 2024-01-07 DIAGNOSIS — S0990XA Unspecified injury of head, initial encounter: Secondary | ICD-10-CM | POA: Diagnosis not present

## 2024-01-07 DIAGNOSIS — Y92410 Unspecified street and highway as the place of occurrence of the external cause: Secondary | ICD-10-CM | POA: Insufficient documentation

## 2024-01-07 DIAGNOSIS — M25512 Pain in left shoulder: Secondary | ICD-10-CM | POA: Diagnosis not present

## 2024-01-07 DIAGNOSIS — I10 Essential (primary) hypertension: Secondary | ICD-10-CM

## 2024-01-07 DIAGNOSIS — M545 Low back pain, unspecified: Secondary | ICD-10-CM | POA: Diagnosis not present

## 2024-01-07 MED ORDER — CYCLOBENZAPRINE HCL 10 MG PO TABS: 5.0000 mg | ORAL_TABLET | Freq: Two times a day (BID) | ORAL | 0 refills | Status: DC | PRN

## 2024-01-07 MED ORDER — MELOXICAM 15 MG PO TABS: 15.0000 mg | ORAL_TABLET | Freq: Every day | ORAL | 0 refills | Status: DC
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
## 2024-01-07 NOTE — ED Notes (Signed)
Patient transported to CT

## 2024-01-07 NOTE — Discharge Instructions (Addendum)
Return to the emergency department immediately if you develop any of the following symptoms: You have numbness, tingling, or weakness in the arms or legs. You develop severe headaches not relieved with medicine. You have severe neck pain, especially tenderness in the middle of the back of your neck. You have changes in bowel or bladder control. There is increasing pain in any area of the body. You have shortness of breath, light-headedness, dizziness, or fainting. You have chest pain. You feel sick to your stomach (nauseous), throw up (vomit), or sweat. You have increasing abdominal discomfort. There is blood in your urine, stool, or vomit. You have pain in your shoulder (shoulder strap areas). You feel your symptoms are getting worse.

## 2024-02-15 ENCOUNTER — Emergency Department (HOSPITAL_COMMUNITY)
Admission: EM | Admit: 2024-02-15 | Discharge: 2024-02-15 | Disposition: A | Discharge status: Home / Self Care | Attending: Emergency Medicine | Admitting: Emergency Medicine

## 2024-02-15 ENCOUNTER — Encounter (HOSPITAL_COMMUNITY): Payer: Self-pay

## 2024-02-15 ENCOUNTER — Emergency Department (HOSPITAL_COMMUNITY)

## 2024-02-15 DIAGNOSIS — Z859 Personal history of malignant neoplasm, unspecified: Secondary | ICD-10-CM | POA: Insufficient documentation

## 2024-02-15 DIAGNOSIS — M5442 Lumbago with sciatica, left side: Secondary | ICD-10-CM | POA: Insufficient documentation

## 2024-02-15 DIAGNOSIS — Z79899 Other long term (current) drug therapy: Secondary | ICD-10-CM | POA: Insufficient documentation

## 2024-02-15 DIAGNOSIS — I10 Essential (primary) hypertension: Secondary | ICD-10-CM | POA: Diagnosis not present

## 2024-02-15 DIAGNOSIS — M549 Dorsalgia, unspecified: Secondary | ICD-10-CM | POA: Diagnosis not present

## 2024-02-15 MED ORDER — LIDOCAINE 5 % EX PTCH
1.0000 | MEDICATED_PATCH | CUTANEOUS | Status: DC
  Administered 2024-02-15: 1 via TRANSDERMAL
  Filled 2024-02-15: qty 1

## 2024-02-15 MED ORDER — KETOROLAC TROMETHAMINE 15 MG/ML IJ SOLN
15.0000 mg | Freq: Once | INTRAMUSCULAR | Status: AC
  Administered 2024-02-15: 15 mg via INTRAMUSCULAR
  Filled 2024-02-15: qty 1

## 2024-02-15 NOTE — Discharge Instructions (Addendum)
 It was a pleasure caring for you today. Please follow up with your primary care provider and the orthopedic provider listed in this discharge paperwork. Seek emergency care if experiencing any new or worsening symptoms.  Alternating between 650 mg Tylenol and 400 mg Advil: The best way to alternate taking Acetaminophen (example Tylenol) and Ibuprofen (example Advil/Motrin) is to take them 3 hours apart. For example, if you take ibuprofen at 6 am you can then take Tylenol at 9 am. You can continue this regimen throughout the day, making sure you do not exceed the recommended maximum dose for each drug.
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
## 2024-02-29 DIAGNOSIS — M545 Low back pain, unspecified: Secondary | ICD-10-CM | POA: Diagnosis not present

## 2024-03-04 ENCOUNTER — Ambulatory Visit: Payer: Self-pay

## 2024-03-04 NOTE — Telephone Encounter (Signed)
  Chief Complaint: tooth pain, blood pressure check Symptoms: tooth pain Frequency: ongoing for couple days Pertinent Negatives: Patient denies chest pain, headache, difficulty breathing, numbness, tingling.  Disposition: [] ED /[] Urgent Care (no appt availability in office) / [x] Appointment(In office/virtual)/ []  [facility]/ [] Home Care/ [] Refused Recommended Disposition /[] [facility]/ []  Follow-up with PCP Additional Notes:  Evaluated at [facility] today for tooth pain but was differed to PCP for elevated blood pressure. He does not recall what his blood pressure reading is, he is not feeling any symptoms of high blood pressure, states he had been evaluated in the past and was on blood pressure medication but not currently. Tooth pain is most bothersome today, plans to use Tylenol as advised while at [facility] today, states not other recommendation except to follow up with PCP. Patient requesting new patient appointment to follow blood pressure and other needs. New patient office visit  scheduled 03/05/24. Care advice discussed as documented in protocol. Discussed reasons to seek emergency evaluation.    Copied from CRM [id number]. Topic: Clinical - Red Word Triage >> Mar 04, 2024 10:49 AM Hector Shade B wrote: Patient is complaining of toothache, went into urgent care but they were unable to treat him due to his current high blood pressure. Patient states he is hurting extremely bad and needed to speak with someone on what he should do Reason for Disposition  Wants doctor to measure BP  Protocols used: Blood Pressure - High-A-AH

## 2024-03-05 ENCOUNTER — Encounter: Payer: Self-pay | Admitting: Student in an Organized Health Care Education/Training Program

## 2024-03-05 ENCOUNTER — Ambulatory Visit (INDEPENDENT_AMBULATORY_CARE_PROVIDER_SITE_OTHER): Admitting: Student in an Organized Health Care Education/Training Program

## 2024-03-05 VITALS — BP 160/122 | HR 74 | Temp 98.2°F | Ht 70.0 in | Wt 195.0 lb

## 2024-03-05 DIAGNOSIS — Z1159 Encounter for screening for other viral diseases: Secondary | ICD-10-CM | POA: Diagnosis not present

## 2024-03-05 DIAGNOSIS — I10 Essential (primary) hypertension: Secondary | ICD-10-CM | POA: Insufficient documentation

## 2024-03-05 DIAGNOSIS — K047 Periapical abscess without sinus: Secondary | ICD-10-CM | POA: Diagnosis not present

## 2024-03-05 MED ORDER — LOSARTAN POTASSIUM-HCTZ 50-12.5 MG PO TABS: 1.0000 | ORAL_TABLET | Freq: Every day | ORAL | 3 refills | Status: DC

## 2024-03-05 NOTE — Assessment & Plan Note (Signed)
 Chronic asymptomatic severe hypertension.  Blood pressure high today at 160/120.  Most likely essential hypertension.  Could possibly be a secondary hypertension.  Would normally check TSH, renin, and aldosterone levels however cannot do that today because he has been using a biotin supplement.  For now we will treat as primary hypertension with 2 agents, will start losartan 50 mg and HCTZ 12.5 mg and accommodation pill.  Will check renal function today along with A1c and lipid panel for risk stratification.

## 2024-03-05 NOTE — Progress Notes (Signed)
 New Patient Office Visit  Subjective    Patient ID: Darryl Campbell, male    DOB: 1986/08/27  Age: 38 y.o. MRN: [id number]  CC:  Chief Complaint  Patient presents with   Establish Care    Pt is here to Est Care Pt reports he has not seen PCP Pt has been going to Urgent Care Pt reports he has hypertension.    HPI  Kyran Applegate presents to establish care  38 year old person with a history of hypertension here for management of hypertension.  He lives with his father in [place], works at the [employer].  He is totally independent and functional.  Did not have a primary care physician before, was using urgent cares for his needs.  Recently went to the urgent care because of a dental infection in his left upper teeth.  They prescribed him antibiotic and were arranging for him to see a dentist.  They noted that he had severe hypertension at that time and arrange for him to have PCP follow-up for treatment.  He reports being told that he has had hypertension a couple years since moving down to [place].  Denies a family history of early hypertension.  No history of heart disease.  No recent illness, no fevers or chills, no chest pain or shortness of breath.  Excellent exertional capacity.  He takes a vitamin supplement for hair regrowth.  No tobacco or alcohol use.  No issues with sleep apnea.   Outpatient Encounter Medications as of 03/05/2024  Medication Sig   losartan-hydrochlorothiazide (HYZAAR) 50-12.5 MG tablet Take 1 tablet by mouth daily.   [DISCONTINUED] cyclobenzaprine (FLEXERIL) 10 MG tablet Take 0.5-1 tablets (5-10 mg total) by mouth 2 (two) times daily as needed for muscle spasms.   [DISCONTINUED] famotidine (PEPCID) 20 MG tablet Take 1 tablet (20 mg total) by mouth 2 (two) times daily. Take for acid reflux.   [DISCONTINUED] meloxicam (MOBIC) 15 MG tablet Take 1 tablet (15 mg total) by mouth daily.   [DISCONTINUED] amLODipine (NORVASC) 5 MG tablet Take 1  tablet (5 mg total) by mouth daily.   [DISCONTINUED] benzonatate (TESSALON) 100 MG capsule Take 1-2 capsules (100-200 mg total) by mouth 3 (three) times daily as needed for cough. (Patient not taking: Reported on 03/05/2024)   [DISCONTINUED] cetirizine (ZYRTEC ALLERGY) 10 MG tablet Take 1 tablet (10 mg total) by mouth daily. (Patient not taking: Reported on 03/05/2024)   [DISCONTINUED] promethazine-dextromethorphan (PROMETHAZINE-DM) 6.25-15 MG/5ML syrup Take 5 mLs by mouth at bedtime as needed for cough. (Patient not taking: Reported on 03/05/2024)   No facility-administered encounter medications on file as of 03/05/2024.    Past Medical History:  Diagnosis Date   Allergy     History reviewed. No pertinent surgical history.  Family History  Problem Relation Age of Onset   Cancer Neg Hx    Diabetes Neg Hx    Heart failure Neg Hx    Hyperlipidemia Neg Hx     Social History   Socioeconomic History   Marital status: Single    Spouse name: n/a   Number of children: 0   Years of education: 12th grade   Highest education level: Not on file  Occupational History   Occupation: Location manager  Tobacco Use   Smoking status: Never   Smokeless tobacco: Never  Substance and Sexual Activity   Alcohol use: No    Alcohol/week: 0.0 standard drinks of alcohol   Drug use: No   Sexual activity: Yes  Partners: Female  Other Topics Concern   Not on file  Social History Narrative   Lives with his parents.   Born in [place]. Came to the [place] age 23.   Social Drivers of Corporate investment banker Strain: Not on file  Food Insecurity: Not on file  Transportation Needs: Not on file  Physical Activity: Not on file  Stress: Not on file (10/19/2023)  Social Connections: Not on file  Intimate Partner Violence: Not on file          Objective    BP (!) 160/120   Pulse 74   Temp 98.2 F (36.8 C)   Ht 5\' 10"  (1.778 m)   Wt 195 lb (88.5 kg)   SpO2 100%   BMI 27.98 kg/m   Physical  Exam  General: Fit appearing young man Neck: Normal thyroid, no nodules Heart: Regular, no murmur  Lungs: Clear throughout no crackles Abdomen: Soft, nontender, no organomegaly Genitals: Normal penis, normal scrotum, normal testicles Extremities: Warm, well-perfused, no edema Psych: Appropriate mood and affect, not anxious or depressed appearing     Assessment & Plan:   Problem List Items Addressed This Visit       High   Hypertension - Primary   Chronic asymptomatic severe hypertension.  Blood pressure high today at 160/120.  Most likely essential hypertension.  Could possibly be a secondary hypertension.  Would normally check TSH, renin, and aldosterone levels however cannot do that today because he has been using a biotin supplement.  For now we will treat as primary hypertension with 2 agents, will start losartan 50 mg and HCTZ 12.5 mg and accommodation pill.  Will check renal function today along with A1c and lipid panel for risk stratification.      Relevant Medications   losartan-hydrochlorothiazide (HYZAAR) 50-12.5 MG tablet   Other Relevant Orders   Basic metabolic panel   Lipid panel   Hemoglobin A1c     Unprioritized   Tooth infection   Appears to have acute infection at the base of tooth 13 and 14.  Currently on treatment with antibiotics.  Tolerating it well with some improvement.  We talked about the need for management with dentistry to fix the underlying caries.  No signs of systemic infection, deep neck space infection, or jaw infection.      Other Visit Diagnoses       Encounter for hepatitis C screening test for low risk patient       Relevant Orders   Hepatitis C Antibody       Return in about 4 weeks (around 04/02/2024).   Tyson Alias, MD

## 2024-03-05 NOTE — Assessment & Plan Note (Addendum)
 Appears to have acute infection at the base of tooth 13 and 14.  Currently on treatment with antibiotics.  Tolerating it well with some improvement.  We talked about the need for management with dentistry to fix the underlying caries.  No signs of systemic infection, deep neck space infection, or jaw infection.

## 2024-04-02 ENCOUNTER — Ambulatory Visit (INDEPENDENT_AMBULATORY_CARE_PROVIDER_SITE_OTHER): Admitting: Student in an Organized Health Care Education/Training Program

## 2024-04-02 ENCOUNTER — Encounter: Payer: Self-pay | Admitting: Student in an Organized Health Care Education/Training Program

## 2024-04-02 VITALS — BP 136/80 | HR 60 | Temp 98.0°F | Ht 70.0 in | Wt 199.2 lb

## 2024-04-02 DIAGNOSIS — Z1159 Encounter for screening for other viral diseases: Secondary | ICD-10-CM | POA: Diagnosis not present

## 2024-04-02 DIAGNOSIS — I10 Essential (primary) hypertension: Secondary | ICD-10-CM

## 2024-04-02 LAB — LIPID PANEL
Cholesterol: 180 mg/dL (ref 0–200)
HDL: 43 mg/dL (ref >39.00)
LDL Cholesterol: 106 mg/dL — ABNORMAL HIGH (ref 0–99)
NonHDL: 136.73
Total CHOL/HDL Ratio: 4
Triglycerides: 153 mg/dL — ABNORMAL HIGH (ref 0.0–149.0)
VLDL: 30.6 mg/dL (ref 0.0–40.0)

## 2024-04-02 LAB — BASIC METABOLIC PANEL WITH GFR
BUN: 13 mg/dL (ref 6–23)
CO2: 30 meq/L (ref 19–32)
Calcium: 9.6 mg/dL (ref 8.4–10.5)
Chloride: 100 meq/L (ref 96–112)
Creatinine, Ser: 1.17 mg/dL (ref 0.40–1.50)
GFR: 80.26 mL/min (ref >60.00)
Glucose, Bld: 129 mg/dL — ABNORMAL HIGH (ref 70–99)
Potassium: 4.2 meq/L (ref 3.5–5.1)
Sodium: 137 meq/L (ref 135–145)

## 2024-04-02 LAB — HEMOGLOBIN A1C: Hgb A1c MFr Bld: 6.6 % — ABNORMAL HIGH (ref 4.6–6.5)

## 2024-04-02 MED ORDER — LOSARTAN POTASSIUM-HCTZ 50-12.5 MG PO TABS: 1.0000 | ORAL_TABLET | Freq: Every day | ORAL | 3 refills | Status: AC

## 2024-04-02 NOTE — Assessment & Plan Note (Signed)
 Chronic and better controlled since starting medication 4 weeks ago.  I think the goal of systolics less than 140 is reasonable for this person with no ischemic vascular risk.  Plan to continue with losartan /HCTZ at current dose.  I sent 90-day refills to his mail pharmacy.  Will check BMP today.
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
## 2024-04-03 LAB — HEPATITIS C ANTIBODY: Hepatitis C Ab: NONREACTIVE

## 2024-04-10 DIAGNOSIS — M545 Low back pain, unspecified: Secondary | ICD-10-CM | POA: Diagnosis not present

## 2024-04-15 DIAGNOSIS — M545 Low back pain, unspecified: Secondary | ICD-10-CM | POA: Diagnosis not present

## 2024-07-01 DIAGNOSIS — M5416 Radiculopathy, lumbar region: Secondary | ICD-10-CM | POA: Diagnosis not present

## 2024-10-02 ENCOUNTER — Ambulatory Visit: Admitting: Student in an Organized Health Care Education/Training Program
